# Patient Record
Sex: Female | Born: 1985 | Race: White | Hispanic: No | Marital: Married | Smoking: Never smoker
Health system: Southern US, Community
[De-identification: ages and names within clinical notes are randomized; demographics above are authoritative.]

## PROBLEM LIST (undated history)

## (undated) ENCOUNTER — Inpatient Hospital Stay (HOSPITAL_COMMUNITY): Payer: Self-pay

## (undated) DIAGNOSIS — Z789 Other specified health status: Secondary | ICD-10-CM

## (undated) HISTORY — PX: BREAST BIOPSY: SHX20

## (undated) HISTORY — PX: WISDOM TOOTH EXTRACTION: SHX21

## (undated) HISTORY — PX: TONSILLECTOMY: SUR1361

## (undated) HISTORY — PX: DILATION AND CURETTAGE OF UTERUS: SHX78

---

## 2012-05-09 ENCOUNTER — Encounter (HOSPITAL_COMMUNITY): Payer: Self-pay | Admitting: *Deleted

## 2012-05-10 ENCOUNTER — Ambulatory Visit (HOSPITAL_COMMUNITY): Payer: PRIVATE HEALTH INSURANCE | Admitting: Anesthesiology

## 2012-05-10 ENCOUNTER — Encounter (HOSPITAL_COMMUNITY)
Admission: AD | Disposition: A | Discharge status: Home / Self Care | Payer: Self-pay | Source: Ambulatory Visit | Attending: Obstetrics and Gynecology

## 2012-05-10 ENCOUNTER — Encounter (HOSPITAL_COMMUNITY): Payer: Self-pay | Admitting: Anesthesiology

## 2012-05-10 ENCOUNTER — Encounter (HOSPITAL_COMMUNITY): Payer: Self-pay | Admitting: *Deleted

## 2012-05-10 ENCOUNTER — Ambulatory Visit (HOSPITAL_COMMUNITY)
Admission: AD | Admit: 2012-05-10 | Discharge: 2012-05-10 | Disposition: A | Discharge status: Home / Self Care | Payer: PRIVATE HEALTH INSURANCE | Source: Ambulatory Visit | Attending: Obstetrics and Gynecology | Admitting: Obstetrics and Gynecology

## 2012-05-10 DIAGNOSIS — O021 Missed abortion: Secondary | ICD-10-CM | POA: Insufficient documentation

## 2012-05-10 DIAGNOSIS — Z9889 Other specified postprocedural states: Secondary | ICD-10-CM

## 2012-05-10 DIAGNOSIS — O039 Complete or unspecified spontaneous abortion without complication: Secondary | ICD-10-CM

## 2012-05-10 DIAGNOSIS — O034 Incomplete spontaneous abortion without complication: Secondary | ICD-10-CM | POA: Diagnosis present

## 2012-05-10 HISTORY — DX: Other specified health status: Z78.9

## 2012-05-10 HISTORY — PX: DILATION AND EVACUATION: SHX1459

## 2012-05-10 LAB — CBC
HCT: 36.7 % (ref 36.0–46.0)
Hemoglobin: 12.1 g/dL (ref 12.0–15.0)
MCH: 29.7 pg (ref 26.0–34.0)
RBC: 4.07 MIL/uL (ref 3.87–5.11)

## 2012-05-10 SURGERY — DILATION AND EVACUATION, UTERUS
Anesthesia: Monitor Anesthesia Care | Site: Vagina | Wound class: Clean Contaminated

## 2012-05-10 MED ORDER — ONDANSETRON HCL 4 MG/2ML IJ SOLN
INTRAMUSCULAR | Status: AC
  Filled 2012-05-10: qty 2

## 2012-05-10 MED ORDER — LACTATED RINGERS IV SOLN
INTRAVENOUS | Status: DC
  Administered 2012-05-10 (×2): via INTRAVENOUS
  Administered 2012-05-10: 125 mL/h via INTRAVENOUS

## 2012-05-10 MED ORDER — MIDAZOLAM HCL 5 MG/5ML IJ SOLN
INTRAMUSCULAR | Status: DC | PRN
  Administered 2012-05-10: 2 mg via INTRAVENOUS

## 2012-05-10 MED ORDER — CHLOROPROCAINE HCL 1 % IJ SOLN
INTRAMUSCULAR | Status: DC | PRN
  Administered 2012-05-10: 20 mL

## 2012-05-10 MED ORDER — FENTANYL CITRATE 0.05 MG/ML IJ SOLN
INTRAMUSCULAR | Status: DC | PRN
  Administered 2012-05-10 (×4): 50 ug via INTRAVENOUS

## 2012-05-10 MED ORDER — CEFAZOLIN SODIUM-DEXTROSE 2-3 GM-% IV SOLR
2.0000 g | INTRAVENOUS | Status: AC
  Administered 2012-05-10: 2 g via INTRAVENOUS

## 2012-05-10 MED ORDER — LIDOCAINE HCL (CARDIAC) 20 MG/ML IV SOLN
INTRAVENOUS | Status: AC
  Filled 2012-05-10: qty 5

## 2012-05-10 MED ORDER — FENTANYL CITRATE 0.05 MG/ML IJ SOLN
INTRAMUSCULAR | Status: AC
  Filled 2012-05-10: qty 8

## 2012-05-10 MED ORDER — MIDAZOLAM HCL 2 MG/2ML IJ SOLN
INTRAMUSCULAR | Status: AC
  Filled 2012-05-10: qty 2

## 2012-05-10 MED ORDER — KETOROLAC TROMETHAMINE 30 MG/ML IJ SOLN
INTRAMUSCULAR | Status: AC
  Filled 2012-05-10: qty 1

## 2012-05-10 MED ORDER — FENTANYL CITRATE 0.05 MG/ML IJ SOLN: 25.0000 ug | INTRAMUSCULAR | Status: DC | PRN

## 2012-05-10 MED ORDER — PROPOFOL 10 MG/ML IV EMUL
INTRAVENOUS | Status: AC
  Filled 2012-05-10: qty 40

## 2012-05-10 MED ORDER — LIDOCAINE HCL (CARDIAC) 20 MG/ML IV SOLN
INTRAVENOUS | Status: DC | PRN
  Administered 2012-05-10 (×2): 20 mg via INTRAVENOUS

## 2012-05-10 MED ORDER — KETOROLAC TROMETHAMINE 30 MG/ML IJ SOLN
INTRAMUSCULAR | Status: DC | PRN
  Administered 2012-05-10: 30 mg via INTRAMUSCULAR

## 2012-05-10 MED ORDER — KETOROLAC TROMETHAMINE 30 MG/ML IJ SOLN: 15.0000 mg | Freq: Once | INTRAMUSCULAR | Status: DC | PRN

## 2012-05-10 MED ORDER — PROPOFOL 10 MG/ML IV EMUL
INTRAVENOUS | Status: DC | PRN
  Administered 2012-05-10: 75 ug/kg/min via INTRAVENOUS

## 2012-05-10 MED ORDER — CHLOROPROCAINE HCL 1 % IJ SOLN
INTRAMUSCULAR | Status: AC
  Filled 2012-05-10: qty 30

## 2012-05-10 MED ORDER — CEFAZOLIN SODIUM-DEXTROSE 2-3 GM-% IV SOLR
INTRAVENOUS | Status: AC
  Filled 2012-05-10: qty 50

## 2012-05-10 MED ORDER — ONDANSETRON HCL 4 MG/2ML IJ SOLN
INTRAMUSCULAR | Status: DC | PRN
  Administered 2012-05-10: 4 mg via INTRAVENOUS

## 2012-05-10 MED ORDER — PROPOFOL 10 MG/ML IV EMUL
INTRAVENOUS | Status: DC | PRN
  Administered 2012-05-10: 40 mg via INTRAVENOUS
  Administered 2012-05-10 (×2): 20 mg via INTRAVENOUS

## 2012-05-10 SURGICAL SUPPLY — 17 items
CLOTH BEACON ORANGE TIMEOUT ST (SAFETY) ×2 IMPLANT
DECANTER SPIKE VIAL GLASS SM (MISCELLANEOUS) ×2 IMPLANT
GLOVE BIO SURGEON STRL SZ7 (GLOVE) ×2 IMPLANT
GOWN STRL REIN XL XLG (GOWN DISPOSABLE) ×4 IMPLANT
KIT BERKELEY 1ST TRIMESTER 3/8 (MISCELLANEOUS) ×2 IMPLANT
NEEDLE SPNL 20GX3.5 QUINCKE YW (NEEDLE) ×2 IMPLANT
NS IRRIG 1000ML POUR BTL (IV SOLUTION) ×2 IMPLANT
PACK VAGINAL MINOR WOMEN LF (CUSTOM PROCEDURE TRAY) ×2 IMPLANT
PAD OB MATERNITY 4.3X12.25 (PERSONAL CARE ITEMS) ×2 IMPLANT
PAD PREP 24X48 CUFFED NSTRL (MISCELLANEOUS) ×2 IMPLANT
SET BERKELEY SUCTION TUBING (SUCTIONS) ×2 IMPLANT
SYR CONTROL 10ML LL (SYRINGE) ×2 IMPLANT
TOWEL OR 17X24 6PK STRL BLUE (TOWEL DISPOSABLE) ×4 IMPLANT
VACURETTE 10 RIGID CVD (CANNULA) IMPLANT
VACURETTE 7MM CVD STRL WRAP (CANNULA) IMPLANT
VACURETTE 8 RIGID CVD (CANNULA) ×2 IMPLANT
VACURETTE 9 RIGID CVD (CANNULA) IMPLANT

## 2012-05-10 NOTE — Transfer of Care (Signed)
Immediate Anesthesia Transfer of Care Note  Patient: Laurie [name]  Procedure(s) Performed: Procedure(s) (LRB) with comments: DILATATION AND EVACUATION (N/A)  Patient Location: PACU  Anesthesia Type:MAC  Level of Consciousness: awake, alert , oriented and patient cooperative  Airway & Oxygen Therapy: Patient Spontanous Breathing and Patient connected to nasal cannula oxygen  Post-op Assessment: Report given to PACU RN and Post -op Vital signs reviewed and stable  Post vital signs: Reviewed and stable  Complications: No apparent anesthesia complications

## 2012-05-10 NOTE — H&P (Signed)
  History and physical exam unchanged 

## 2012-05-10 NOTE — H&P (Signed)
Laurie [name] is an 27 y.o. female.presents for dilation and evacuation for nonviable first trimester pregnancy.  By dates should be 9+ weeks.  Sonogram last week c/w 6+ maybe small fetal pole but no heart activity.  Had bleeding over the weekend and sono yesterday still no change and sac is irregular.  Blood type O+.  Pertinent Gynecological History: Menses: regular every month without intermenstrual spotting Bleeding: from SAB Contraception: none DES exposure: denies Blood transfusions: none Sexually transmitted diseases: no past history Previous GYN Procedures: none  Last mammogram: na Date: na Last pap: normal Date: less then one year OB History: G1, P0   Menstrual History: Menarche age: 35 No LMP recorded.    Past Medical History  Diagnosis Date  . No pertinent past medical history     Past Surgical History  Procedure Date  . Tonsillectomy     History reviewed. No pertinent family history.  Social History:  reports that she has never smoked. She does not have any smokeless tobacco history on file. She reports that she does not drink alcohol or use illicit drugs.  Allergies:  Allergies  Allergen Reactions  . Sulfa Antibiotics Hives    No prescriptions prior to admission    Review of Systems  All other systems reviewed and are negative.    There were no vitals taken for this visit. Physical Exam  Constitutional: She is oriented to person, place, and time. She appears well-developed and well-nourished.  HENT:  Head: Normocephalic and atraumatic.  Eyes: Pupils are equal, round, and reactive to light.  Cardiovascular: Normal rate, regular rhythm and normal heart sounds.   Respiratory: Effort normal and breath sounds normal.  GI: Soft. Bowel sounds are normal.  Genitourinary:       Minimal vaginal bleeding. Uterus 8-9 weeks in size. Adnexa clear   Musculoskeletal: She exhibits no edema.  Neurological: She is alert and oriented to person, place, and time.     No results found for this or any previous visit (from the past 24 hour(s)).  No results found.  Assessment/Plan: Nonviable first trimester pregnancy. Precede with dilation and curettage.  Risks discussed including:  Infection;  Hemorrhage that could require transfusion with risk of aids or hepatitis; excessive bleeding could require hysterectomy; possible need for repeat procedure for retained poc;  Risk of perforation which could require further surgery for damage to adjacent organs; risk of DVT and PE.  Rhyker Silversmith S 05/10/2012, 5:36 AM

## 2012-05-10 NOTE — Op Note (Signed)
Patient name  Laurie [name], bally DICTATION#  [id number] CSN# [id number]  [name], MD 05/10/2012 1:30 PM

## 2012-05-10 NOTE — Preoperative (Signed)
Beta Blockers   Reason not to administer Beta Blockers:Not Applicable 

## 2012-05-10 NOTE — Brief Op Note (Signed)
05/10/2012  1:28 PM  PATIENT:  Laurie [name]  27 y.o. female  PRE-OPERATIVE DIAGNOSIS:  MISSED AB CPT [id number]  POST-OPERATIVE DIAGNOSIS:  missed abortion  PROCEDURE:  Procedure(s) (LRB) with comments: DILATATION AND EVACUATION (N/A)  SURGEON:  Surgeon(s) and Role:    * [name], MD - Primary  PHYSICIAN ASSISTANT:   ASSISTANTS: none   ANESTHESIA:   IV sedation and paracervical block  EBL:     BLOOD ADMINISTERED:none  DRAINS: none   LOCAL MEDICATIONS USED:  OTHER nesicaine  SPECIMEN:  Source of Specimen:  products of conception  DISPOSITION OF SPECIMEN:  PATHOLOGY  COUNTS:  YES  TOURNIQUET:  * No tourniquets in log *  DICTATION: .Other Dictation: Dictation Number [id number]  PLAN OF CARE: Discharge to home after PACU  PATIENT DISPOSITION:  PACU - hemodynamically stable.   Delay start of Pharmacological VTE agent (>24hrs) due to surgical blood loss or risk of bleeding: not applicable

## 2012-05-10 NOTE — Anesthesia Preprocedure Evaluation (Signed)
Anesthesia Evaluation  Patient identified by MRN, date of birth, ID band Patient awake    Reviewed: Allergy & Precautions, H&P , NPO status , Patient's Chart, lab work & pertinent test results, reviewed documented beta blocker date and time   History of Anesthesia Complications Negative for: history of anesthetic complications  Airway Mallampati: II TM Distance: <3 FB Neck ROM: full    Dental  (+) Teeth Intact   Pulmonary neg pulmonary ROS,  breath sounds clear to auscultation  Pulmonary exam normal       Cardiovascular Exercise Tolerance: Good negative cardio ROS  Rhythm:regular Rate:Normal     Neuro/Psych negative neurological ROS  negative psych ROS   GI/Hepatic negative GI ROS, Neg liver ROS,   Endo/Other  negative endocrine ROS  Renal/GU negative Renal ROS  negative genitourinary   Musculoskeletal   Abdominal   Peds  Hematology negative hematology ROS (+)   Anesthesia Other Findings   Reproductive/Obstetrics (+) Pregnancy (missed ab)                           Anesthesia Physical Anesthesia Plan  ASA: II  Anesthesia Plan: MAC   Post-op Pain Management:    Induction:   Airway Management Planned:   Additional Equipment:   Intra-op Plan:   Post-operative Plan:   Informed Consent: I have reviewed the patients History and Physical, chart, labs and discussed the procedure including the risks, benefits and alternatives for the proposed anesthesia with the patient or authorized representative who has indicated his/her understanding and acceptance.   Dental Advisory Given  Plan Discussed with: CRNA and Surgeon  Anesthesia Plan Comments:         Anesthesia Quick Evaluation

## 2012-05-10 NOTE — Anesthesia Postprocedure Evaluation (Signed)
Anesthesia Post Note  Patient: Laurie [name]  Procedure(s) Performed: Procedure(s) (LRB): DILATATION AND EVACUATION (N/A)  Anesthesia type: MAC  Patient location: PACU  Post pain: Pain level controlled  Post assessment: Post-op Vital signs reviewed  Last Vitals:  Filed Vitals:   05/10/12 1400  BP: 118/61  Pulse: 72  Temp: 36.9 C  Resp: 19    Post vital signs: Reviewed  Level of consciousness: sedated  Complications: No apparent anesthesia complications

## 2012-05-11 ENCOUNTER — Encounter (HOSPITAL_COMMUNITY): Payer: Self-pay | Admitting: Obstetrics and Gynecology

## 2012-05-11 NOTE — Op Note (Signed)
[name], Laurie [name]                  ACCOUNT NO.:  [id number]  MEDICAL RECORD NO.:  [id number]  LOCATION:  [facility]                          FACILITY:  [facility]  PHYSICIAN:  [name], M.D.   DATE OF BIRTH:  May 21, 1985  DATE OF PROCEDURE:  05/10/2012 DATE OF DISCHARGE:  05/10/2012                              OPERATIVE REPORT   PREOPERATIVE DIAGNOSIS:  Nonviable 1st trimester pregnancy.  POSTOPERATIVE DIAGNOSIS:  Nonviable 1st trimester pregnancy.  OPERATIVE PROCEDURE:  Paracervical block with cervical dilatation, evacuation of uterine contents.  ANESTHESIA:  Paracervical block and sedation.  BLOOD LOSS:  Minimal.  PACKS AND DRAINS:  None.  INTRAOPERATIVE BLOOD PLACED:  None.  COMPLICATIONS:  None.  INDICATIONS:  As previously dictated in the H and P.  PROCEDURE:  As follows:  The patient was taken to the OR and placed in supine position.  After sedation, she was placed in dorsal lithotomy position using the Allen stirrups.  The patient then draped as a sterile field.  A speculum was placed in vaginal vault.  The cervix and vagina were cleansed out with Betadine.  Anterior lip of the cervix was anesthetized with 1% Nesacaine and secured with single-tooth tenaculum. We then put a paracervical block using approximately 20 mL of 1% Nesacaine.  Uterus was posterior sounded to approximately 10-11 cm. Cervix was serially dilated to a size 27 Pratt dilator.  Size 8 curved suction curette was introduced.  Intrauterine contents were evacuated using suction curetting.  After approximately 3 passes, no tissue was obtained.  Uterus was contracting down well.  At this point in time, we sharply curetted the intrauterine cavity.  It felt clear by its gritty feel.  Repeat suction curetting revealed no additional tissue.  Again, the uterus was contracting down well.  There was minimal bleeding.  No signs of perforation.  Of note, the patient's blood type is O positive. At this point in time,  the single- tooth tenaculum and speculum removed.  The patient taken out of the dorsal lithotomy position.  Once alert, transferred to recovery room in good condition.  Sponge, instrument, and needle count was correct by circulating nurse x2.     [name], M.D.     JSM/MEDQ  D:  05/10/2012  T:  05/11/2012  Job:  [id number]

## 2012-08-04 ENCOUNTER — Encounter (HOSPITAL_COMMUNITY): Payer: Self-pay | Admitting: *Deleted

## 2012-08-06 ENCOUNTER — Encounter (HOSPITAL_COMMUNITY): Payer: Self-pay | Admitting: Pharmacy Technician

## 2012-08-07 ENCOUNTER — Encounter (HOSPITAL_COMMUNITY): Payer: Self-pay | Admitting: Anesthesiology

## 2012-08-07 ENCOUNTER — Ambulatory Visit (HOSPITAL_COMMUNITY)
Admission: RE | Admit: 2012-08-07 | Discharge: 2012-08-07 | Disposition: A | Discharge status: Home / Self Care | Payer: PRIVATE HEALTH INSURANCE | Source: Ambulatory Visit | Attending: Obstetrics and Gynecology | Admitting: Obstetrics and Gynecology

## 2012-08-07 ENCOUNTER — Ambulatory Visit (HOSPITAL_COMMUNITY): Payer: PRIVATE HEALTH INSURANCE | Admitting: Anesthesiology

## 2012-08-07 ENCOUNTER — Encounter (HOSPITAL_COMMUNITY)
Admission: RE | Disposition: A | Discharge status: Home / Self Care | Payer: Self-pay | Source: Ambulatory Visit | Attending: Obstetrics and Gynecology

## 2012-08-07 DIAGNOSIS — O034 Incomplete spontaneous abortion without complication: Secondary | ICD-10-CM

## 2012-08-07 DIAGNOSIS — Z9889 Other specified postprocedural states: Secondary | ICD-10-CM

## 2012-08-07 DIAGNOSIS — O021 Missed abortion: Secondary | ICD-10-CM | POA: Insufficient documentation

## 2012-08-07 HISTORY — DX: Other specified health status: Z78.9

## 2012-08-07 HISTORY — PX: DILATION AND EVACUATION: SHX1459

## 2012-08-07 LAB — CBC
MCHC: 33.4 g/dL (ref 30.0–36.0)
Platelets: 220 10*3/uL (ref 150–400)
RDW: 13.5 % (ref 11.5–15.5)

## 2012-08-07 LAB — ABO/RH: ABO/RH(D): O POS

## 2012-08-07 SURGERY — DILATION AND EVACUATION, UTERUS
Anesthesia: Monitor Anesthesia Care | Site: Uterus | Wound class: Clean Contaminated

## 2012-08-07 MED ORDER — KETOROLAC TROMETHAMINE 30 MG/ML IJ SOLN
INTRAMUSCULAR | Status: DC | PRN
  Administered 2012-08-07: 30 mg via INTRAVENOUS

## 2012-08-07 MED ORDER — LIDOCAINE HCL (CARDIAC) 20 MG/ML IV SOLN
INTRAVENOUS | Status: AC
  Filled 2012-08-07: qty 5

## 2012-08-07 MED ORDER — FENTANYL CITRATE 0.05 MG/ML IJ SOLN
INTRAMUSCULAR | Status: DC | PRN
  Administered 2012-08-07 (×2): 50 ug via INTRAVENOUS

## 2012-08-07 MED ORDER — MIDAZOLAM HCL 2 MG/2ML IJ SOLN
INTRAMUSCULAR | Status: AC
  Filled 2012-08-07: qty 2

## 2012-08-07 MED ORDER — CEFAZOLIN SODIUM-DEXTROSE 2-3 GM-% IV SOLR
INTRAVENOUS | Status: AC
  Filled 2012-08-07: qty 50

## 2012-08-07 MED ORDER — OXYCODONE-ACETAMINOPHEN 7.5-325 MG PO TABS: 1.0000 | ORAL_TABLET | ORAL | Status: DC | PRN

## 2012-08-07 MED ORDER — ONDANSETRON HCL 4 MG/2ML IJ SOLN
INTRAMUSCULAR | Status: DC | PRN
  Administered 2012-08-07: 4 mg via INTRAVENOUS

## 2012-08-07 MED ORDER — LACTATED RINGERS IV SOLN
INTRAVENOUS | Status: DC
  Administered 2012-08-07 (×2): via INTRAVENOUS

## 2012-08-07 MED ORDER — CHLOROPROCAINE HCL 1 % IJ SOLN
INTRAMUSCULAR | Status: AC
  Filled 2012-08-07: qty 30

## 2012-08-07 MED ORDER — MIDAZOLAM HCL 5 MG/5ML IJ SOLN
INTRAMUSCULAR | Status: DC | PRN
  Administered 2012-08-07: 2 mg via INTRAVENOUS

## 2012-08-07 MED ORDER — LIDOCAINE HCL (CARDIAC) 20 MG/ML IV SOLN
INTRAVENOUS | Status: DC | PRN
  Administered 2012-08-07 (×2): 30 mg via INTRAVENOUS

## 2012-08-07 MED ORDER — PROPOFOL 10 MG/ML IV EMUL
INTRAVENOUS | Status: AC
  Filled 2012-08-07: qty 20

## 2012-08-07 MED ORDER — FENTANYL CITRATE 0.05 MG/ML IJ SOLN: 25.0000 ug | INTRAMUSCULAR | Status: DC | PRN

## 2012-08-07 MED ORDER — METOCLOPRAMIDE HCL 5 MG/ML IJ SOLN: 10.0000 mg | Freq: Once | INTRAMUSCULAR | Status: DC | PRN

## 2012-08-07 MED ORDER — PROPOFOL 10 MG/ML IV EMUL
INTRAVENOUS | Status: DC | PRN
  Administered 2012-08-07 (×2): 30 mg via INTRAVENOUS
  Administered 2012-08-07 (×2): 20 mg via INTRAVENOUS

## 2012-08-07 MED ORDER — CEFAZOLIN SODIUM-DEXTROSE 2-3 GM-% IV SOLR
2.0000 g | INTRAVENOUS | Status: AC
  Administered 2012-08-07: 2 g via INTRAVENOUS

## 2012-08-07 MED ORDER — KETOROLAC TROMETHAMINE 30 MG/ML IJ SOLN
INTRAMUSCULAR | Status: AC
  Filled 2012-08-07: qty 1

## 2012-08-07 MED ORDER — ONDANSETRON HCL 4 MG/2ML IJ SOLN
INTRAMUSCULAR | Status: AC
  Filled 2012-08-07: qty 2

## 2012-08-07 MED ORDER — CHLOROPROCAINE HCL 1 % IJ SOLN
INTRAMUSCULAR | Status: DC | PRN
  Administered 2012-08-07: 20 mL

## 2012-08-07 MED ORDER — MEPERIDINE HCL 25 MG/ML IJ SOLN: 6.2500 mg | INTRAMUSCULAR | Status: DC | PRN

## 2012-08-07 MED ORDER — FENTANYL CITRATE 0.05 MG/ML IJ SOLN
INTRAMUSCULAR | Status: AC
  Filled 2012-08-07: qty 2

## 2012-08-07 SURGICAL SUPPLY — 18 items
CLOTH BEACON ORANGE TIMEOUT ST (SAFETY) ×2 IMPLANT
DECANTER SPIKE VIAL GLASS SM (MISCELLANEOUS) ×2 IMPLANT
GLOVE BIO SURGEON STRL SZ7 (GLOVE) ×2 IMPLANT
GLOVE SURG SS PI 6.5 STRL IVOR (GLOVE) ×2 IMPLANT
GOWN STRL REIN XL XLG (GOWN DISPOSABLE) ×4 IMPLANT
KIT BERKELEY 1ST TRIMESTER 3/8 (MISCELLANEOUS) ×2 IMPLANT
NEEDLE SPNL 20GX3.5 QUINCKE YW (NEEDLE) ×2 IMPLANT
NS IRRIG 1000ML POUR BTL (IV SOLUTION) ×2 IMPLANT
PACK VAGINAL MINOR WOMEN LF (CUSTOM PROCEDURE TRAY) ×2 IMPLANT
PAD OB MATERNITY 4.3X12.25 (PERSONAL CARE ITEMS) ×2 IMPLANT
PAD PREP 24X48 CUFFED NSTRL (MISCELLANEOUS) ×2 IMPLANT
SET BERKELEY SUCTION TUBING (SUCTIONS) ×2 IMPLANT
SYR CONTROL 10ML LL (SYRINGE) ×2 IMPLANT
TOWEL OR 17X24 6PK STRL BLUE (TOWEL DISPOSABLE) ×4 IMPLANT
VACURETTE 10 RIGID CVD (CANNULA) IMPLANT
VACURETTE 7MM CVD STRL WRAP (CANNULA) IMPLANT
VACURETTE 8 RIGID CVD (CANNULA) ×2 IMPLANT
VACURETTE 9 RIGID CVD (CANNULA) IMPLANT

## 2012-08-07 NOTE — Transfer of Care (Signed)
Immediate Anesthesia Transfer of Care Note  Patient: Laurie [name]  Procedure(s) Performed: Procedure(s) with comments: DILATATION AND EVACUATION WITH GENETIC STUDIES (N/A) - chromosome studies  Patient Location: PACU  Anesthesia Type:MAC  Level of Consciousness: awake, alert , sedated and patient cooperative  Airway & Oxygen Therapy: Patient Spontanous Breathing and Patient connected to nasal cannula oxygen  Post-op Assessment: Report given to PACU RN and Post -op Vital signs reviewed and stable  Post vital signs: Reviewed and stable  Complications: No apparent anesthesia complications

## 2012-08-07 NOTE — Anesthesia Postprocedure Evaluation (Signed)
  Anesthesia Post-op Note  Patient: Laurie [name]  Procedure(s) Performed: Procedure(s) with comments: DILATATION AND EVACUATION WITH GENETIC STUDIES (N/A) - chromosome studies  Patient Location: PACU  Anesthesia Type:MAC  Level of Consciousness: awake, alert  and oriented  Airway and Oxygen Therapy: Patient Spontanous Breathing  Post-op Pain: none  Post-op Assessment: Post-op Vital signs reviewed, Patient's Cardiovascular Status Stable, Respiratory Function Stable, Patent Airway, No signs of Nausea or vomiting and Pain level controlled  Post-op Vital Signs: Reviewed and stable  Complications: No apparent anesthesia complications

## 2012-08-07 NOTE — H&P (Signed)
  History and physical exam unchanged 

## 2012-08-07 NOTE — H&P (Signed)
Laurie [name] is an 27 y.o. female.presenting for dilation and evacuation.  Followed with serial sonograms.  Started bleeding last week.  Sonogram with gestational sac but no fetal pole or yolk sac now for evacuation.    Pertinent Gynecological History: Menses: regular every month without intermenstrual spotting Bleeding: none Contraception: none DES exposure: denies Blood transfusions: none Sexually transmitted diseases: no past history Previous GYN Procedures: DNC  Last mammogram: na Date: na  Last pap: normal Date: 2013 OB History: G2, P0   Menstrual History: Menarche age: 24  Patient's last menstrual period was 06/05/2012.    Past Medical History  Diagnosis Date  . No pertinent past medical history   . Medical history non-contributory     Past Surgical History  Procedure Laterality Date  . Tonsillectomy    . Wisdom tooth extraction    . Dilation and evacuation  05/10/2012    Procedure: DILATATION AND EVACUATION;  Surgeon: [name], MD;  Location: [facility];  Service: Gynecology;  Laterality: N/A;    History reviewed. No pertinent family history.  Social History:  reports that she has never smoked. She has never used smokeless tobacco. She reports that she does not drink alcohol or use illicit drugs.  Allergies:  Allergies  Allergen Reactions  . Sulfa Antibiotics Hives    paralysis    No prescriptions prior to admission    Review of Systems  All other systems reviewed and are negative.    Last menstrual period 06/05/2012. Physical Exam  Constitutional: She is oriented to person, place, and time. She appears well-developed and well-nourished.  Eyes: Pupils are equal, round, and reactive to light.  Cardiovascular: Normal rate, regular rhythm and normal heart sounds.   Respiratory: Effort normal and breath sounds normal.  GI: Soft. Bowel sounds are normal.  Genitourinary:  Uterus 9 weeks adenexae clear  Neurological: She is alert and oriented to person,  place, and time.    No results found for this or any previous visit (from the past 24 hour(s)).  No results found.  Assessment/Plan: Nonviable first trimester pregnancy Precede with dilation and curettage.  Risks discussed.  Infection.  Hemorrhage that could require transfusions with risk of aids and or hepatitis. Excessive bleeding could require hysterectomy.  Perforation that could lead to injury to adjacent organs that could require exploratory surgery.  Risk of DVT and PE>   Laurie [name] S 08/07/2012, 7:32 AM

## 2012-08-07 NOTE — Anesthesia Preprocedure Evaluation (Signed)
Anesthesia Evaluation  Patient identified by MRN, date of birth, ID band Patient awake    Reviewed: Allergy & Precautions, H&P , NPO status , Patient's Chart, lab work & pertinent test results  Airway Mallampati: II TM Distance: >3 FB Neck ROM: Full    Dental  (+) Teeth Intact   Pulmonary neg pulmonary ROS,  breath sounds clear to auscultation  Pulmonary exam normal       Cardiovascular negative cardio ROS  Rhythm:Regular Rate:Normal     Neuro/Psych negative neurological ROS  negative psych ROS   GI/Hepatic negative GI ROS, Neg liver ROS,   Endo/Other  negative endocrine ROS  Renal/GU negative Renal ROS     Musculoskeletal negative musculoskeletal ROS (+)   Abdominal   Peds  Hematology negative hematology ROS (+)   Anesthesia Other Findings   Reproductive/Obstetrics (+) Pregnancy Missed Ab                           Anesthesia Physical Anesthesia Plan  ASA: II  Anesthesia Plan: MAC   Post-op Pain Management:    Induction: Intravenous  Airway Management Planned: Natural Airway  Additional Equipment:   Intra-op Plan:   Post-operative Plan: Extubation in OR  Informed Consent: I have reviewed the patients History and Physical, chart, labs and discussed the procedure including the risks, benefits and alternatives for the proposed anesthesia with the patient or authorized representative who has indicated his/her understanding and acceptance.   Dental advisory given  Plan Discussed with: CRNA, Anesthesiologist and Surgeon  Anesthesia Plan Comments:         Anesthesia Quick Evaluation

## 2012-08-07 NOTE — Brief Op Note (Signed)
08/07/2012  1:15 PM  PATIENT:  Laurie [name]  27 y.o. female  PRE-OPERATIVE DIAGNOSIS:  missed abortion   POST-OPERATIVE DIAGNOSIS:  missed abortion  PROCEDURE:  Procedure(s) with comments: DILATATION AND EVACUATION WITH GENETIC STUDIES (N/A) - chromosome studies  SURGEON:  Surgeon(s) and Role:    * [name], MD - Primary  PHYSICIAN ASSISTANT:   ASSISTANTS: none   ANESTHESIA:   IV sedation and paracervical block  EBL:  Total I/O In: 1000 [I.V.:1000] Out: -   BLOOD ADMINISTERED:none  DRAINS: none   LOCAL MEDICATIONS USED:  Amount: 20 ml and OTHER nesicaine  SPECIMEN:  Source of Specimen:  products of conception  DISPOSITION OF SPECIMEN:  PATHOLOGY  COUNTS:  YES  TOURNIQUET:  * No tourniquets in log *  DICTATION: .Other Dictation: Dictation Number [id number]  PLAN OF CARE: Discharge to home after PACU  PATIENT DISPOSITION:  PACU - hemodynamically stable.   Delay start of Pharmacological VTE agent (>24hrs) due to surgical blood loss or risk of bleeding: not applicable

## 2012-08-07 NOTE — Op Note (Signed)
Patient name Laurie [name], [place] DICTATION#  [id number] CSN# [id number]  [name], MD 08/07/2012 1:16 PM

## 2012-08-08 ENCOUNTER — Encounter (HOSPITAL_COMMUNITY): Payer: Self-pay | Admitting: Obstetrics and Gynecology

## 2012-08-08 NOTE — Op Note (Signed)
NAMENOVELLE, ADDAIR                  ACCOUNT NO.:  [id number]  MEDICAL RECORD NO.:  [id number]  LOCATION:  [facility]                          [facility]  PHYSICIAN:  [name], M.D.   DATE OF BIRTH:  [birth year]  DATE OF PROCEDURE:  08/07/2012 DATE OF DISCHARGE:  08/07/2012                              OPERATIVE REPORT   PREOPERATIVE DIAGNOSIS:  Nonviable 1st trimester pregnancy.  POSTOPERATIVE DIAGNOSIS:  Nonviable 1st trimester pregnancy.  OPERATIVE PROCEDURE:  Paracervical block, cervical dilatation with uterine evacuation.  SURGEON:  [name], M.D.  ANESTHESIA:  Paracervical block and sedation.  BLOOD LOSS:  Minimal.  PACKS AND DRAINS:  None.  INTRAOPERATIVE BLOOD PLACED:  None.  COMPLICATIONS:  None.  INDICATION:  Dictated in history and physical.  PROCEDURE:  As follows; the patient was taken to the OR and placed in a supine position.  She was placed in the dorsal supine position using Allen stirrups.  After sedation, the patient was draped in sterile field.  A speculum was placed in vaginal vault.  Cervix was cleansed with Betadine.  Paracervical block using 1% Nesacaine.  Cervix was secured with a single-tooth tenaculum.  Uterus sounded to approximately 9 cm.  Cervix serially dilated to a size 27 Pratt dilator.  Size 8 curved suction curette was introduced, intrauterine contents were removed using suction curetting.  Once no additional tissue was obtained, we sharply curetted followed by repeat suction curetting followed by repeat sharp curetting, felt that all quadrants were cleared by gritty feel, no further tissue was obtained with suction curette. Uterus was contracting down well.  Bleeding was minimal.  Tissue was sent for genetics.  At this point in time, single-tooth speculum was removed.  The patient taken out of the dorsal lithotomy position.  Once alert, transferred to recovery room in good condition.  Sponge, instrument, and needle count  was reported as correct by circulating nurse x2.     [name], M.D.     JSM/MEDQ  D:  08/07/2012  T:  08/08/2012  Job:  [id number]

## 2012-08-31 LAB — CHROMOSOME STD, POC(TISSUE)-NCBH

## 2012-09-07 ENCOUNTER — Other Ambulatory Visit (HOSPITAL_COMMUNITY): Payer: Self-pay | Admitting: Obstetrics and Gynecology

## 2012-09-07 DIAGNOSIS — N96 Recurrent pregnancy loss: Secondary | ICD-10-CM

## 2012-09-07 DIAGNOSIS — Z8759 Personal history of other complications of pregnancy, childbirth and the puerperium: Secondary | ICD-10-CM

## 2012-09-13 ENCOUNTER — Other Ambulatory Visit (HOSPITAL_COMMUNITY): Payer: Self-pay | Admitting: Obstetrics and Gynecology

## 2012-09-13 ENCOUNTER — Encounter (HOSPITAL_COMMUNITY): Payer: Self-pay

## 2012-09-13 ENCOUNTER — Ambulatory Visit (HOSPITAL_COMMUNITY)
Admission: RE | Admit: 2012-09-13 | Discharge: 2012-09-13 | Disposition: A | Discharge status: Home / Self Care | Payer: PRIVATE HEALTH INSURANCE | Source: Ambulatory Visit | Attending: Obstetrics and Gynecology | Admitting: Obstetrics and Gynecology

## 2012-09-13 DIAGNOSIS — N96 Recurrent pregnancy loss: Secondary | ICD-10-CM | POA: Insufficient documentation

## 2012-09-13 MED ORDER — IOHEXOL 300 MG/ML  SOLN
20.0000 mL | Freq: Once | INTRAMUSCULAR | Status: AC | PRN
  Administered 2012-09-13: 20 mL

## 2012-12-22 LAB — OB RESULTS CONSOLE ABO/RH: RH Type: POSITIVE

## 2012-12-22 LAB — OB RESULTS CONSOLE HIV ANTIBODY (ROUTINE TESTING): HIV: NONREACTIVE

## 2012-12-22 LAB — OB RESULTS CONSOLE GC/CHLAMYDIA
Chlamydia: NEGATIVE
GC PROBE AMP, GENITAL: NEGATIVE

## 2012-12-22 LAB — OB RESULTS CONSOLE RPR: RPR: NONREACTIVE

## 2012-12-22 LAB — OB RESULTS CONSOLE HEPATITIS B SURFACE ANTIGEN: Hepatitis B Surface Ag: NEGATIVE

## 2012-12-22 LAB — OB RESULTS CONSOLE RUBELLA ANTIBODY, IGM: Rubella: IMMUNE

## 2012-12-22 LAB — OB RESULTS CONSOLE ANTIBODY SCREEN: ANTIBODY SCREEN: NEGATIVE

## 2013-01-08 ENCOUNTER — Inpatient Hospital Stay (HOSPITAL_COMMUNITY): Payer: PRIVATE HEALTH INSURANCE

## 2013-01-08 ENCOUNTER — Encounter (HOSPITAL_COMMUNITY): Payer: Self-pay | Admitting: *Deleted

## 2013-01-08 ENCOUNTER — Inpatient Hospital Stay (HOSPITAL_COMMUNITY)
Admission: AD | Admit: 2013-01-08 | Discharge: 2013-01-08 | Disposition: A | Discharge status: Home / Self Care | Payer: PRIVATE HEALTH INSURANCE | Source: Ambulatory Visit | Attending: Obstetrics and Gynecology | Admitting: Obstetrics and Gynecology

## 2013-01-08 DIAGNOSIS — O4690 Antepartum hemorrhage, unspecified, unspecified trimester: Secondary | ICD-10-CM

## 2013-01-08 DIAGNOSIS — O209 Hemorrhage in early pregnancy, unspecified: Secondary | ICD-10-CM | POA: Insufficient documentation

## 2013-01-08 LAB — URINALYSIS, ROUTINE W REFLEX MICROSCOPIC
Bilirubin Urine: NEGATIVE
Ketones, ur: NEGATIVE mg/dL
Nitrite: NEGATIVE
Specific Gravity, Urine: <1.005 — ABNORMAL LOW (ref 1.005–1.030)
Urobilinogen, UA: 0.2 mg/dL (ref 0.0–1.0)

## 2013-01-08 LAB — URINE MICROSCOPIC-ADD ON: WBC, UA: NONE SEEN WBC/hpf (ref <3)

## 2013-01-08 LAB — CBC
HCT: 32.8 % — ABNORMAL LOW (ref 36.0–46.0)
Hemoglobin: 11.2 g/dL — ABNORMAL LOW (ref 12.0–15.0)
MCH: 29.6 pg (ref 26.0–34.0)
MCHC: 34.1 g/dL (ref 30.0–36.0)
MCV: 86.8 fL (ref 78.0–100.0)
RDW: 13.6 % (ref 11.5–15.5)

## 2013-01-08 NOTE — MAU Provider Note (Signed)
Chief Complaint: Vaginal Bleeding   None    SUBJECTIVE HPI: Laurie [name] is a 27 y.o. G3P0020 at [redacted]w[redacted]d by LMP who presents to maternity admissions reporting bright red vaginal bleeding x1 episode when she wiped today.  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  She has had U/S in this pregnancy with [facility]HR in the office.  Last intercourse was yesterday.  She denies vaginal itching/burning, urinary symptoms, h/a, dizziness, n/v, or fever/chills.  Past Medical History  Diagnosis Date  . No pertinent past medical history   . Medical history non-contributory    Past Surgical History  Procedure Laterality Date  . Tonsillectomy    . Wisdom tooth extraction    . Dilation and evacuation  05/10/2012    Procedure: DILATATION AND EVACUATION;  Surgeon: [name], MD;  Location: [facility];  Service: Gynecology;  Laterality: N/A;  . Dilation and evacuation N/A 08/07/2012    Procedure: DILATATION AND EVACUATION WITH GENETIC STUDIES;  Surgeon: [name], MD;  Location: [facility];  Service: Gynecology;  Laterality: N/A;  chromosome studies   History   Social History  . Marital Status: Married    Spouse Name: N/A    Number of Children: N/A  . Years of Education: N/A   Occupational History  . Not on file.   Social History Main Topics  . Smoking status: Never Smoker   . Smokeless tobacco: Never Used  . Alcohol Use: No  . Drug Use: No  . Sexual Activity: Yes    Birth Control/ Protection: None     Comment: approx [redacted] wks gestation per pt   Other Topics Concern  . Not on file   Social History Narrative  . No narrative on file   No current facility-administered medications on file prior to encounter.   No current outpatient prescriptions on file prior to encounter.   Allergies  Allergen Reactions  . Sulfa Antibiotics Hives and Other (See Comments)    Causes paralysis    ROS: Pertinent items in HPI  OBJECTIVE Blood pressure 134/83, pulse 105, temperature 98.2 F  (36.8 C), temperature source Oral, resp. rate 18, last menstrual period 10/28/2012. GENERAL: Well-developed, well-nourished female in no acute distress.  HEENT: Normocephalic HEART: normal rate RESP: normal effort ABDOMEN: Soft, non-tender EXTREMITIES: Nontender, no edema NEURO: Alert and oriented Pelvic exam: Cervix pink, visually closed, without lesion, small amount dark brown blood, vaginal walls and external genitalia normal Bimanual exam: Cervix 0/long/high, firm, anterior, neg CMT, uterus nontender, ~10 week size, adnexa without tenderness, enlargement, or mass   LAB RESULTS Results for orders placed during the hospital encounter of 01/08/13 (from the past 24 hour(s))  CBC     Status: Abnormal   Collection Time    01/08/13  4:37 PM      Result Value Range   WBC 9.1  4.0 - 10.5 K/uL   RBC 3.78 (*) 3.87 - 5.11 MIL/uL   Hemoglobin 11.2 (*) 12.0 - 15.0 g/dL   HCT 16.1 (*) 09.6 - 04.5 %   MCV 86.8  78.0 - 100.0 fL   MCH 29.6  26.0 - 34.0 pg   MCHC 34.1  30.0 - 36.0 g/dL   RDW 40.9  81.1 - 91.4 %   Platelets 242  150 - 400 K/uL    IMAGING US Ob Comp Less 14 Wks  01/08/2013   *RADIOLOGY REPORT*  Clinical Data: Bleeding. 2 previous spontaneous abortions earlier this year.  OBSTETRIC <14  [facility] ULTRASOUND  Technique:  Transabdominal ultrasound was performed for evaluation of the gestation as well as the maternal uterus and adnexal regions.  Comparison:  None.  Intrauterine gestational sac: Single, normal in shape. Yolk sac: Present Embryo: Present Cardiac Activity: Present Heart Rate: 174 bpm  CRL:  4.35 cm 11 w  2 d       [place] EDC: 07/25/2013  Maternal uterus/Adnexae: Normal-appearing ovaries.  No subchorionic hemorrhage identified.  IMPRESSION:  1.  Single living intrauterine fetus corresponding to an age of 11 weeks 2 days. 2.  EDC by today's exam is 07/28/2013.   Original Report Authenticated By: Norva Pavlov, M.D.    ASSESSMENT 1. Vaginal bleeding in pregnancy, first trimester      PLAN Consult with Dr Rana Snare Discharge home F/U in office Return to MAU as needed    Medication List    ASK your doctor about these medications       aspirin EC 81 MG tablet  Take 81 mg by mouth daily.     folic acid 400 MCG tablet  Commonly known as:  FOLVITE  Take 400 mcg by mouth at bedtime.     prenatal multivitamin Tabs tablet  Take 1 tablet by mouth at bedtime.     Progesterone 50 MG Supp  Place 1 suppository vaginally 2 (two) times daily.         Sharen Counter Certified Nurse-Midwife 01/08/2013  4:58 PM

## 2013-01-08 NOTE — MAU Note (Signed)
Pt presents with complaints of vaginal bleeding that started at work today. She states that she noticed bleeding when she wiped and then noticed a lot of blood in the toilet when she went to  the bathroom. Pt is taking progesterone for the pregnancy due to having 2 previous miscarriages this year since January

## 2013-03-25 ENCOUNTER — Encounter (HOSPITAL_COMMUNITY): Payer: Self-pay | Admitting: Family

## 2013-03-25 ENCOUNTER — Inpatient Hospital Stay (HOSPITAL_COMMUNITY)
Admission: AD | Admit: 2013-03-25 | Discharge: 2013-03-25 | Disposition: A | Discharge status: Home / Self Care | Payer: PRIVATE HEALTH INSURANCE | Source: Ambulatory Visit | Attending: Obstetrics and Gynecology | Admitting: Obstetrics and Gynecology

## 2013-03-25 ENCOUNTER — Inpatient Hospital Stay (HOSPITAL_COMMUNITY): Payer: PRIVATE HEALTH INSURANCE

## 2013-03-25 DIAGNOSIS — O209 Hemorrhage in early pregnancy, unspecified: Secondary | ICD-10-CM | POA: Insufficient documentation

## 2013-03-25 DIAGNOSIS — O4690 Antepartum hemorrhage, unspecified, unspecified trimester: Secondary | ICD-10-CM

## 2013-03-25 DIAGNOSIS — O4692 Antepartum hemorrhage, unspecified, second trimester: Secondary | ICD-10-CM

## 2013-03-25 LAB — URINALYSIS, ROUTINE W REFLEX MICROSCOPIC
Ketones, ur: NEGATIVE mg/dL
Nitrite: NEGATIVE
Specific Gravity, Urine: 1.015 (ref 1.005–1.030)
pH: 7 (ref 5.0–8.0)

## 2013-03-25 LAB — URINE MICROSCOPIC-ADD ON

## 2013-03-25 NOTE — MAU Note (Signed)
27yo, G3P0 at [redacted]w[redacted]d, presents to MAU with c/o vaginal bleeding when wiping at 0830 today. Reports wearing one pantyliner since that occurrence; reports a little blood on the pantyliner at Triage.  Patient reports 2 miscarriages since January; is on daily baby Aspirin. Took progesterone in first trimester. Denies cramping/pain.

## 2013-03-25 NOTE — MAU Provider Note (Signed)
  History     CSN: [id number]  Arrival date and time: 03/25/13 1058   First Provider Initiated Contact with Patient 03/25/13 1218      Chief Complaint  Patient presents with  . Vaginal Bleeding   HPI Laurie [name] 27 y.o. [redacted]w[redacted]d   Client had painless bright red bleeding this morning upon awakening.  Denies any vaginal leaking.  Denies any cramping or lower abdominal pain.  Bleeding has stopped at this time.  Hx of 2 miscarriages and is taking a baby aspirin every day.    OB History   Grav Para Term Preterm Abortions TAB SAB Ect Mult Living   3 0 0 0 2 0 2 0 0 0       Past Medical History  Diagnosis Date  . No pertinent past medical history   . Medical history non-contributory     Past Surgical History  Procedure Laterality Date  . Tonsillectomy    . Wisdom tooth extraction    . Dilation and evacuation  05/10/2012    Procedure: DILATATION AND EVACUATION;  Surgeon: [name], MD;  Location: [facility];  Service: Gynecology;  Laterality: N/A;  . Dilation and evacuation N/A 08/07/2012    Procedure: DILATATION AND EVACUATION WITH GENETIC STUDIES;  Surgeon: [name], MD;  Location: [facility];  Service: Gynecology;  Laterality: N/A;  chromosome studies    History reviewed. No pertinent family history.  History  Substance Use Topics  . Smoking status: Never Smoker   . Smokeless tobacco: Never Used  . Alcohol Use: No    Allergies:  Allergies  Allergen Reactions  . Sulfa Antibiotics Hives and Other (See Comments)    Causes paralysis    Prescriptions prior to admission  Medication Sig Dispense Refill  . aspirin EC 81 MG tablet Take 81 mg by mouth daily.      . folic acid (FOLVITE) 400 MCG tablet Take 400 mcg by mouth at bedtime.      . Prenatal Vit-Fe Fumarate-FA (PRENATAL MULTIVITAMIN) TABS tablet Take 1 tablet by mouth at bedtime.        Review of Systems  Constitutional: Negative for fever.  Gastrointestinal: Negative for nausea, vomiting and abdominal pain.   Genitourinary: Negative for dysuria.       Vaginal bleeding. No vaginal leaking.   Physical Exam   Blood pressure 105/63, pulse 88, temperature 98.5 F (36.9 C), temperature source Oral, resp. rate 16, height 5\' 4"  (1.626 m), weight 140 lb (63.504 kg), last menstrual period 10/28/2012.  Physical Exam  Nursing note and vitals reviewed. Constitutional: She is oriented to person, place, and time. She appears well-developed and well-nourished. No distress.  HENT:  Head: Normocephalic.  Eyes: EOM are normal.  Neck: Neck supple.  Musculoskeletal: Normal range of motion.  Neurological: She is alert and oriented to person, place, and time.  Skin: Skin is warm and dry.  Psychiatric: She has a normal mood and affect.    MAU Course  Procedures  MDM 1230  Consult with Dr. Renaldo Fiddler re: plan of care 1445  Preliminary ultrasound reviewed and discussed with Dr. Renaldo Fiddler.  Assessment and Plan  Bleeding in pregnancy - cause unidentified  Plan Follow up in the office if you have additional bleeding.  Otherwise keep your prenatal care appointment as scheduled. Pelvic rest for the next few days.   Laurie [name] 03/25/2013, 12:30 PM

## 2013-03-26 LAB — URINE CULTURE: Culture: NO GROWTH

## 2013-05-03 NOTE — L&D Delivery Note (Signed)
Delivery Note At 6:00 AM a viable female was delivered via Vaginal, Spontaneous Delivery (Presentation: ;  ).  APGAR: 9, 9; weight .   Placenta status: Intact, Spontaneous.  Cord: 3 vessels with the following complications: None.  Cord pH: not sent Hx secondary placental lobe>>>placenta to path, ut explored>>>clean Rectum + sphincter intact  Anesthesia: Epidural  Episiotomy: Median Lacerations: none Suture Repair: 3.0 vicryl rapide Est. Blood Loss (mL): 400  Mom to postpartum.  Baby to Nursery.  Meriel PicaHOLLAND,Wah Sabic M 07/28/2013, 6:28 AM

## 2013-06-25 ENCOUNTER — Inpatient Hospital Stay (HOSPITAL_COMMUNITY)
Admission: AD | Admit: 2013-06-25 | Discharge: 2013-06-29 | DRG: 781 | Disposition: A | Discharge status: Home / Self Care | Payer: PRIVATE HEALTH INSURANCE | Source: Ambulatory Visit | Attending: Obstetrics and Gynecology | Admitting: Obstetrics and Gynecology

## 2013-06-25 ENCOUNTER — Encounter (HOSPITAL_COMMUNITY): Payer: Self-pay

## 2013-06-25 ENCOUNTER — Inpatient Hospital Stay (HOSPITAL_COMMUNITY): Payer: PRIVATE HEALTH INSURANCE

## 2013-06-25 DIAGNOSIS — O99891 Other specified diseases and conditions complicating pregnancy: Secondary | ICD-10-CM | POA: Diagnosis present

## 2013-06-25 DIAGNOSIS — O9989 Other specified diseases and conditions complicating pregnancy, childbirth and the puerperium: Secondary | ICD-10-CM

## 2013-06-25 DIAGNOSIS — O4690 Antepartum hemorrhage, unspecified, unspecified trimester: Principal | ICD-10-CM

## 2013-06-25 DIAGNOSIS — O4700 False labor before 37 completed weeks of gestation, unspecified trimester: Secondary | ICD-10-CM | POA: Diagnosis present

## 2013-06-25 DIAGNOSIS — O4693 Antepartum hemorrhage, unspecified, third trimester: Secondary | ICD-10-CM

## 2013-06-25 DIAGNOSIS — Z22330 Carrier of Group B streptococcus: Secondary | ICD-10-CM

## 2013-06-25 LAB — URINALYSIS, ROUTINE W REFLEX MICROSCOPIC
BILIRUBIN URINE: NEGATIVE
Glucose, UA: NEGATIVE mg/dL
Ketones, ur: NEGATIVE mg/dL
NITRITE: NEGATIVE
PH: 6 (ref 5.0–8.0)
Protein, ur: NEGATIVE mg/dL
UROBILINOGEN UA: 0.2 mg/dL (ref 0.0–1.0)

## 2013-06-25 LAB — CBC
HCT: 35.4 % — ABNORMAL LOW (ref 36.0–46.0)
HEMOGLOBIN: 12.1 g/dL (ref 12.0–15.0)
MCH: 30.3 pg (ref 26.0–34.0)
MCHC: 34.2 g/dL (ref 30.0–36.0)
MCV: 88.5 fL (ref 78.0–100.0)
Platelets: 209 10*3/uL (ref 150–400)
RBC: 4 MIL/uL (ref 3.87–5.11)
RDW: 13.8 % (ref 11.5–15.5)
WBC: 8.9 10*3/uL (ref 4.0–10.5)

## 2013-06-25 LAB — URINE MICROSCOPIC-ADD ON

## 2013-06-25 LAB — COMPREHENSIVE METABOLIC PANEL
ALT: 11 U/L (ref 0–35)
AST: 16 U/L (ref 0–37)
Albumin: 3 g/dL — ABNORMAL LOW (ref 3.5–5.2)
Alkaline Phosphatase: 169 U/L — ABNORMAL HIGH (ref 39–117)
BUN: 6 mg/dL (ref 6–23)
CALCIUM: 9.4 mg/dL (ref 8.4–10.5)
CO2: 22 meq/L (ref 19–32)
CREATININE: 0.49 mg/dL — AB (ref 0.50–1.10)
Chloride: 105 mEq/L (ref 96–112)
GLUCOSE: 80 mg/dL (ref 70–99)
Potassium: 4.1 mEq/L (ref 3.7–5.3)
SODIUM: 138 meq/L (ref 137–147)
TOTAL PROTEIN: 6.6 g/dL (ref 6.0–8.3)
Total Bilirubin: <0.2 mg/dL — ABNORMAL LOW (ref 0.3–1.2)

## 2013-06-25 LAB — DIC (DISSEMINATED INTRAVASCULAR COAGULATION)PANEL
D-Dimer, Quant: 0.8 ug/mL-FEU — ABNORMAL HIGH (ref 0.00–0.48)
Fibrinogen: 504 mg/dL — ABNORMAL HIGH (ref 204–475)
Smear Review: NONE SEEN

## 2013-06-25 LAB — TYPE AND SCREEN
ABO/RH(D): O POS
ANTIBODY SCREEN: NEGATIVE

## 2013-06-25 LAB — DIC (DISSEMINATED INTRAVASCULAR COAGULATION) PANEL
APTT: 27 s (ref 24–37)
INR: 0.96 (ref 0.00–1.49)
PLATELETS: 199 10*3/uL (ref 150–400)
PROTHROMBIN TIME: 12.6 s (ref 11.6–15.2)

## 2013-06-25 LAB — URIC ACID: URIC ACID, SERUM: 3.4 mg/dL (ref 2.4–7.0)

## 2013-06-25 LAB — OB RESULTS CONSOLE GBS: GBS: POSITIVE

## 2013-06-25 LAB — GROUP B STREP BY PCR: Group B strep by PCR: POSITIVE — AB

## 2013-06-25 MED ORDER — ACETAMINOPHEN 325 MG PO TABS
650.0000 mg | ORAL_TABLET | ORAL | Status: DC | PRN
  Administered 2013-06-25 – 2013-06-26 (×2): 650 mg via ORAL
  Filled 2013-06-25 (×2): qty 2

## 2013-06-25 MED ORDER — DOCUSATE SODIUM 100 MG PO CAPS
100.0000 mg | ORAL_CAPSULE | Freq: Every day | ORAL | Status: DC
  Administered 2013-06-25 – 2013-06-29 (×5): 100 mg via ORAL
  Filled 2013-06-25 (×5): qty 1

## 2013-06-25 MED ORDER — MAGNESIUM SULFATE BOLUS VIA INFUSION
4.0000 g | Freq: Once | INTRAVENOUS | Status: AC
  Administered 2013-06-25: 4 g via INTRAVENOUS
  Filled 2013-06-25: qty 500

## 2013-06-25 MED ORDER — MAGNESIUM SULFATE 40 G IN LACTATED RINGERS - SIMPLE
2.0000 g/h | INTRAVENOUS | Status: DC
  Administered 2013-06-25: 2 g/h via INTRAVENOUS
  Administered 2013-06-26: 2.5 g/h via INTRAVENOUS
  Administered 2013-06-27: 2 g/h via INTRAVENOUS
  Filled 2013-06-25 (×3): qty 500

## 2013-06-25 MED ORDER — CALCIUM CARBONATE ANTACID 500 MG PO CHEW: 2.0000 | CHEWABLE_TABLET | ORAL | Status: DC | PRN

## 2013-06-25 MED ORDER — PRENATAL MULTIVITAMIN CH
1.0000 | ORAL_TABLET | Freq: Every day | ORAL | Status: DC
  Administered 2013-06-25 – 2013-06-29 (×5): 1 via ORAL
  Filled 2013-06-25 (×5): qty 1

## 2013-06-25 MED ORDER — LACTATED RINGERS IV SOLN
INTRAVENOUS | Status: DC
  Administered 2013-06-25 – 2013-06-28 (×6): via INTRAVENOUS

## 2013-06-25 MED ORDER — LACTATED RINGERS IV SOLN
INTRAVENOUS | Status: DC
  Administered 2013-06-25: 12:00:00 via INTRAVENOUS

## 2013-06-25 MED ORDER — LACTATED RINGERS IV BOLUS (SEPSIS)
250.0000 mL | Freq: Once | INTRAVENOUS | Status: AC
  Administered 2013-06-25: 250 mL via INTRAVENOUS

## 2013-06-25 MED ORDER — ZOLPIDEM TARTRATE 5 MG PO TABS: 5.0000 mg | ORAL_TABLET | Freq: Every evening | ORAL | Status: DC | PRN

## 2013-06-25 MED ORDER — TERBUTALINE SULFATE 1 MG/ML IJ SOLN
0.2500 mg | Freq: Once | INTRAMUSCULAR | Status: AC
  Administered 2013-06-25: 0.25 mg via SUBCUTANEOUS
  Filled 2013-06-25: qty 1

## 2013-06-25 NOTE — Plan of Care (Signed)
Problem: Consults Goal: Birthing Suites Patient Information Press F2 to bring up selections list   Pt < [redacted] weeks EGA     

## 2013-06-25 NOTE — H&P (Signed)
Laurie [name] is a 28 y.o. female presenting for C/O vaginal bleeding starting this am at work. Not feeling UCs. No fever or HA. No ROM. Transferred to MAU and received IV fluids and SQ terbutaline x 1. UCs persisted. Labs and U/S OK. Final U/S report pending.  Maternal Medical History:  Reason for admission: Vaginal bleeding.   Fetal activity: Perceived fetal activity is normal.      OB History   Grav Para Term Preterm Abortions TAB SAB Ect Mult Living   3 0 0 0 2 0 2 0 0 0      Past Medical History  Diagnosis Date  . No pertinent past medical history   . Medical history non-contributory    Past Surgical History  Procedure Laterality Date  . Tonsillectomy    . Wisdom tooth extraction    . Dilation and evacuation  05/10/2012    Procedure: DILATATION AND EVACUATION;  Surgeon: Juluis MireJohn S McComb, MD;  Location: [facility];  Service: Gynecology;  Laterality: N/A;  . Dilation and evacuation N/A 08/07/2012    Procedure: DILATATION AND EVACUATION WITH GENETIC STUDIES;  Surgeon: Juluis MireJohn S McComb, MD;  Location: [facility];  Service: Gynecology;  Laterality: N/A;  chromosome studies  . Dilation and curettage of uterus     Family History: family history includes Asthma in her maternal aunt; COPD in her maternal aunt; Cancer in her maternal aunt; Diabetes in her maternal grandmother; Hyperlipidemia in her father; Hypertension in her father and mother; Varicose Veins in her maternal aunt. Social History:  reports that she has never smoked. She has never used smokeless tobacco. She reports that she does not drink alcohol or use illicit drugs.   Prenatal Transfer Tool  Maternal Diabetes: No Genetic Screening: Normal Maternal Ultrasounds/Referrals: Normal Fetal Ultrasounds or other Referrals:  None Maternal Substance Abuse:  No Significant Maternal Medications:  None Significant Maternal Lab Results:  None Other Comments:  None  Review of Systems  Constitutional: Negative for fever.  Eyes: Negative for  blurred vision.  Gastrointestinal: Negative for abdominal pain.  Neurological: Negative for headaches.      Blood pressure 113/70, pulse 90, temperature 98.4 F (36.9 C), temperature source Oral, resp. rate 18, height 5\' 4"  (1.626 m), weight 157 lb (71.215 kg), last menstrual period 10/28/2012, SpO2 100.00%.   Fetal Exam Fetal State Assessment: Category I - tracings are normal.     Physical Exam  Cardiovascular: Normal rate and regular rhythm.   Respiratory: Effort normal and breath sounds normal.  GI: Soft. There is no tenderness.  Neurological:  DTR 1+ on magnesium sulfate   Cx 1-2 / th / soft in office Prenatal labs: ABO, Rh: --/--/O POS (02/23 1040) Antibody: NEG (02/23 1040) Rubella:   RPR:    HBsAg:    HIV:    GBS: Positive (02/23 0000)   Assessment/Plan: 28 yo G3P0 at 8134 2/7 weeks with PTL Now on magnesium sulfate and UCs about 4-5/hour on 2 gm/hr D/W patient and husband above Will continue magnesium through tonight.   Penelope Fittro II,Xyler Terpening E 06/25/2013, 8:51 PM

## 2013-06-25 NOTE — Progress Notes (Signed)
Ur chart review completed.  

## 2013-06-25 NOTE — MAU Note (Signed)
Pt states began bleeding once she got to work this am, had gush of blood and saw a clot. Back is achy, however is not feeling contractions.

## 2013-06-25 NOTE — MAU Provider Note (Signed)
History     CSN: [id number]  Arrival date and time: 06/25/13 1021   First Provider Initiated Contact with Patient 06/25/13 1100      Chief Complaint  Patient presents with  . Vaginal Bleeding   HPI This is a 28 y.o. female at [redacted]w[redacted]d who was sent from Dr Tomblin's office with third trimester bleeding. Has contractions but only feels them in her back. Was at work and had moderately heavy bleeding. Does feel fetal movement. [place] was ordered from office and is being done immediately upon her arrival.   RN Note:  Pt states began bleeding once she got to work this am, had gush of blood and saw a clot. Back is achy, however is not feeling contractions.        OB History   Grav Para Term Preterm Abortions TAB SAB Ect Mult Living   3 0 0 0 2 0 2 0 0 0       Past Medical History  Diagnosis Date  . No pertinent past medical history   . Medical history non-contributory     Past Surgical History  Procedure Laterality Date  . Tonsillectomy    . Wisdom tooth extraction    . Dilation and evacuation  05/10/2012    Procedure: DILATATION AND EVACUATION;  Surgeon: [name], MD;  Location: [facility];  Service: Gynecology;  Laterality: N/A;  . Dilation and evacuation N/A 08/07/2012    Procedure: DILATATION AND EVACUATION WITH GENETIC STUDIES;  Surgeon: [name], MD;  Location: [facility];  Service: Gynecology;  Laterality: N/A;  chromosome studies    No family history on file.  History  Substance Use Topics  . Smoking status: Never Smoker   . Smokeless tobacco: Never Used  . Alcohol Use: No    Allergies:  Allergies  Allergen Reactions  . Sulfa Antibiotics Hives and Other (See Comments)    Causes paralysis    Prescriptions prior to admission  Medication Sig Dispense Refill  . aspirin EC 81 MG tablet Take 81 mg by mouth daily.      . folic acid (FOLVITE) 400 MCG tablet Take 400 mcg by mouth at bedtime.      . Prenatal Vit-Fe Fumarate-FA (PRENATAL MULTIVITAMIN) TABS tablet Take 1  tablet by mouth at bedtime.        Review of Systems  Constitutional: Negative for fever, chills and malaise/fatigue.  Gastrointestinal: Negative for nausea, vomiting and abdominal pain.  Genitourinary: Negative for dysuria.  Musculoskeletal: Positive for back pain.  Neurological: Negative for dizziness and headaches.   Physical Exam   Last menstrual period 10/28/2012.  Physical Exam  Constitutional: She is oriented to person, place, and time. She appears well-developed and well-nourished. No distress.  HENT:  Head: Normocephalic.  Cardiovascular: Normal rate and regular rhythm.  Exam reveals no gallop and no friction rub.   No murmur heard. Respiratory: Effort normal. No respiratory distress. She has no wheezes. She has no rales.  GI: Soft. She exhibits no distension. There is no tenderness. There is no rebound and no guarding.  Genitourinary: Uterus normal. Vaginal discharge (Speculum exam:  Moderate blood in vault, no clots. ) found.   Cervix 1+/50%/-3/vtx Blood was watery so I did a fern slide, negative ferning   Musculoskeletal: Normal range of motion.  Neurological: She is alert and oriented to person, place, and time.  Skin: Skin is warm and dry.  Psychiatric: She has a normal mood and affect.   FHR reactive UCs irregular, every  2-7 minutes  MAU Course  Procedures  MDM US done on arrival  >>  SIUP, placenta posterior, marginal cord insertion, AFI 10.63, cervix not visualized, anatomy normal. No evidence of abruption.    Discussed with Dr Henderson Cloudomblin who ordered Terbutaline for tocolysis.  >>  Did not stop contractions. Dr Henderson Cloudomblin notified.  He will call us back with plan.  Assessment and Plan  A:  SIUP at 5067w2d        Third trimester bleeding  P:  Plan per Dr Jose Persiaomblin  Laurie [name] 06/25/2013, 11:45 AM

## 2013-06-25 NOTE — Progress Notes (Signed)
CRITICAL VALUE ALERT  Critical value received:  GBS +  Date of notification:  06/25/2013  Time of notification:  1328  Critical value read back:yes  Nurse who received alert:  Lundon Rosier R. Roxan Hockeyobinson RN  MD notified (1st page):  [phone], in department, M. Williams CNM  Time of first page:  n/a  Responding MD:  Artelia LarocheM. Williams CNM  Time MD responded:  [phone]

## 2013-06-26 ENCOUNTER — Encounter (HOSPITAL_COMMUNITY): Payer: Self-pay | Admitting: Advanced Practice Midwife

## 2013-06-26 DIAGNOSIS — A491 Streptococcal infection, unspecified site: Secondary | ICD-10-CM | POA: Insufficient documentation

## 2013-06-26 MED ORDER — SODIUM CHLORIDE 0.9 % IV SOLN
3.0000 g | Freq: Four times a day (QID) | INTRAVENOUS | Status: DC
  Administered 2013-06-26 – 2013-06-28 (×8): 3 g via INTRAVENOUS
  Filled 2013-06-26 (×10): qty 3

## 2013-06-26 NOTE — Progress Notes (Signed)
Patient ID: Laurie PerchesAshley M Belmontes, female   DOB: 1986/04/13, 28 y.o.   MRN: [id number] Pt still with spotting but improved since yesterday Still feels 6-8 ctxs /h  VSSAF FHR 140s Cat 1 Ctxs 6-8 x/h  Abd:  Gravid, nt Neg homans Bil  IUP at 34 3/7 PTL - will increase mag to achieve quisceince IV unasyn for GBS Vag bleeding improved- most likely bloody show DL

## 2013-06-27 MED ORDER — LIDOCAINE 1%/NA BICARB 0.1 MEQ INJECTION
INJECTION | INTRAVENOUS | Status: AC
  Filled 2013-06-27: qty 1

## 2013-06-27 NOTE — Progress Notes (Deleted)
Ur chart review completed.  

## 2013-06-27 NOTE — Progress Notes (Signed)
S: Patient is doing well. Had small episode of bright red VB around lunchtime today and because of that I kept her on 2 gram of Magnesium today. O:  Afebrile VSS Abdomen is soft and non tender FHR is Category 1 Toco  No UCs  IMPRESSION: IUP at 34 w 4 days PTL Vaginal bleeding  I suspect placental abruption  PLAN: Possible discontinue Magnesium tomorrow am Continue observation in hospital for PTL/ Return of vaginal bleeding She lives one hour away

## 2013-06-27 NOTE — Progress Notes (Signed)
S: Patient is feeling much better. About to take a shower. No further bleeding or contractions.  O: afebrile VSS General alert and oriented Lung CTAB Car RRR Abdomen is soft and non tender  IMPRESSION: Preterm LABOR GBBS +  PLAN: Decrease Magnesium to 2 gram per hour Try to discontinue the magnesium today Possible discharge home tomorrow

## 2013-06-27 NOTE — Progress Notes (Signed)
Ur chart review completed per request.  

## 2013-06-28 MED ORDER — SODIUM CHLORIDE 0.9 % IJ SOLN
3.0000 mL | Freq: Two times a day (BID) | INTRAMUSCULAR | Status: DC
  Administered 2013-06-28 – 2013-06-29 (×3): 3 mL via INTRAVENOUS

## 2013-06-28 MED ORDER — SODIUM CHLORIDE 0.9 % IJ SOLN
3.0000 mL | INTRAMUSCULAR | Status: DC | PRN
Start: 2013-06-28 — End: 2013-06-29

## 2013-06-28 NOTE — Progress Notes (Signed)
No new complaints.  Last vaginal bleeding yesterday.  No CTX, +FM.   VSS. AF Gen: A&O x 3 Abd: soft, NT Ext: no c/c/e  FHT: Cat I Toco: flat  28yo G3P0 at 4113w5d with bleeding in the third trimester -Will d/c Mag -Monitor for stability with possible d/c home tomorrow

## 2013-06-28 NOTE — Progress Notes (Signed)
Pt off the monitor and up to shower

## 2013-06-29 ENCOUNTER — Inpatient Hospital Stay (HOSPITAL_COMMUNITY): Payer: PRIVATE HEALTH INSURANCE

## 2013-06-29 NOTE — Progress Notes (Signed)
Patient ID: Laurie PerchesAshley M Wirtanen, female   DOB: 10-Jul-1985, 28 y.o.   MRN: [id number] No further bleeding or ctx's sono bpp 8/8  No abrution. A succenturiate lobe was noted. Amniotic fluid was normal. She'll be discharged home at this point in time.

## 2013-06-29 NOTE — Progress Notes (Signed)
Patient ID: Laurie PerchesAshley M Najarro, female   DOB: July 08, 1985, 28 y.o.   MRN: [id number] S:  SOME BLEEDING AND CLOTS YESTERDAY O: AF VSS      GRAVID UTERUS NONTENDER      FHR CAT ONE      CERVIX POST LONG AND FINGERTIP      BROWNISH DISCHARGE A:  IUP AT 34.6      VAGINAL BLEEDIING      PRETERM CONTRACTIONS OFF MAGNESIUM P:  CHECK SONO IF OK AND NO BLEEDING D/C HOME

## 2013-06-29 NOTE — Discharge Instructions (Signed)
Vaginal Bleeding During Pregnancy A small amount of bleeding from the vagina can happen anytime during pregnancy. It usually stops on its own. However, some bleeding can be serious. Be sure to tell your doctor about all vaginal bleeding. HOME CARE  Get plenty of rest and sleep.  Stay in bed and only get up to go to the bathroom as told by your doctor.  Write down the number of pads you use each day. Note how soaked they are.  Do not use tampons. Do not clean the vagina with a stream of water (douche).  Do not have sex (intercourse) or put anything into your vagina. Have this approved by your doctor.  Save any tissue that comes from your vagina. Show it to your doctor.  Only take medicine as told by your doctor.  Follow your doctor's advice about lifting, driving, and physical activity. GET HELP RIGHT AWAY IF:   You feel your baby move less or not at all.  You pass out (faint) while going to the bathroom.  You have more bleeding.  You start to have contractions.  You have severe cramps in your stomach, back, or belly (abdomen).  You are leaking fluid or have a gush of fluid from your vagina.  You become lightheaded or weak.  You have chills.  You have clumps of tissue or blood clots coming from your vagina.  You have a fever. MAKE SURE YOU:   Understand these instructions.  Will watch your condition.  Will get help right away if you are not doing well or get worse. Document Released: 01/27/2008 Document Revised: 04/05/2012 Document Reviewed: 02/07/2012 [facility] Patient Information 2014 [place], [place].  Preterm Birth Preterm birth is a birth that happens before 37 weeks of pregnancy. Most pregnancies last about 39 41 weeks. Every week in the womb is important and is beneficial to the health of the infant. Infants born before 37 weeks of pregnancy are at a higher risk for complications. Depending on when the infant was born, he or she may be:  Late preterm. Born  between 32 weeks and 37 weeks of pregnancy.  Very preterm. Born at less than 32 weeks of pregnancy.  Extremely preterm. Born at less than 25 weeks of pregnancy. The earlier a baby is born, the more likely the child will have issues related to prematurity. Complications and problems that can be seen in infants born too early include:  Problems breathing (respiratory distress syndrome).  Low birth weight.  Problems feeding.  Sleeping problems.  Yellowing of the skin (jaundice).  Infections such as pneumonia. Babies born very preterm or extremely preterm are at risk for more serious medical issues. These include:  More severe breathing issues.  Eyesight issues.  Brain development issues (intraventricular hemorrhage).  Behavioral and emotional development issues.  Growth and developmental delays.  Cerebral palsy.  Serious feeding or bowel complications (necrotizing enterocolitis). CAUSES  There are two broad categories of preterm birth.  Spontaneous preterm birth. This is a birth resulting from preterm labor (not medically induced) or preterm premature rupture of membranes (PPROM).  Indicated preterm birth. This is a birth resulting from labor being medically induced due to health, personal, or social reasons. RISK FACTORS Preterm birth may be related to certain medical conditions, lifestyle factors, or demographic factors encountered by the mother or fetus.  Medical conditions include:  Multiple gestations (twins, triplets, and so on).  Infection.  Diabetes.  Heart disease.  Kidney disease.  Cervical or uterine abnormalities.  Being underweight.  High blood pressure or preeclampsia.  Premature rupture of membranes (PROM).  Birth defects in the fetus.  Lifestyle factors include:  Poor prenatal care.  Poor nutrition or anemia.  Cigarette smoking.  Consuming alcohol.  High levels of stress and lack of social or emotional support.  Exposure to  chemical or environmental toxins.  Substance abuse.  Demographic factors include:  African-American ethnicity.  Age (younger than 9818 or older than 30 years of age).  Low socioeconomic status. Women with a history of preterm labor or who become pregnant within 3518 months of giving birth are also at increased risk for preterm birth. DIAGNOSIS  Your health care provider may request additional tests to diagnose underlying complications resulting from preterm birth. Tests on the infant may include:  Physical exam.  Blood tests.  Chest X-rays.  Heart-lung monitoring. TREATMENT  After birth, special care will be taken to assess any problems or complications for the infant. Supportive care will be provided for the infant. Treatment depends on what problems are present and any complications that develop. Some preterm infants are cared for in a neonatal intensive care unit. In general, care may include:  Maintaining temperature and oxygen in a clear heated box (baby isolette).  Monitoring the infant's heart rate, breathing, and level of oxygen in the blood.  Monitoring for signs of infection and, if needed, giving IV antibiotic medicine.  Inserting a feeding tube (nose, mouth) or giving IV nutrition if unable to feed.  Inserting a breathing tube (ventilation).  Respiration support (continuous positive airway pressure [CPAP] or oxygen). Treatment will change as the infant builds up strength and is able to breathe and eat on his or her own. For some infants, no special treatment is necessary. Parents may be educated on the potential health risks of prematurity to the infant. HOME CARE INSTRUCTIONS  Understand your infant's special conditions and needs. It may be reassuring to learn about infant CPR.  Monitor your infant in the car seat until he or she grows and matures. Infant car seats can cause breathing difficulties for preterm infants.  Keep your infant warm. Dress your infant in  layers and keep him or her away from drafts, especially in cold months of the year.  Wash your hands thoroughly after going to the bathroom or changing a diaper. Late preterm infants may be more prone to infection.  Follow all your health care provider's instructions for providing support and care to your preterm infant.  Get support from organizations and groups that understand your challenges.  Follow up with your infant's health care provider as directed. Prevention There are some things you can do to help lower your risk of having a preterm infant in the future. These include:  Good prenatal care throughout the entire pregnancy. See a health care provider regularly for advice and tests.  Management of underlying medical conditions.  Proper self-care and lifestyle changes.  Proper diet and weight control.  Watching for signs of various infections. SEEK MEDICAL CARE IF:  Your infant has feeding difficulties.  Your infant has sleeping difficulties.  Your infant has breathing difficulties.  Your infant's skin starts to look yellow.  Your infant shows signs of infection, such as a stuffy nose, fever, crying, or bluish color of the skin. FOR MORE INFORMATION March of D[employer]: www.marchofdimes.com Prematurity.org: www.prematurity.org Document Released: 07/10/2003 Document Revised: 02/07/2013 Document Reviewed: 11/16/2012 [facility] Patient Information 2014 [place]

## 2013-06-29 NOTE — Discharge Summary (Signed)
Admitting diagnosis: Intrauterine pregnancy at 34-2/7 with preterm uterine activity and vaginal bleeding.  Discharge diagnosis: Is the same with resolution of uterine activity with Tocolysis  Complete history and physical please see dictated note.  Course in the hospital: Patient was brought in the hospital. After failing subcutaneous terbutaline she was started on magnesium sulfate. An ultrasound was performed which showed no evidence of any abruption. She was gradually weaned off the magnesium sulfate. She was off magnesium sulfate yesterday. This morning she was having no uterine activity cervix at my exam was long and closed. No blood was noted. Repeat her ultrasound. She did have a succenturiate lobe that we had not noticed before. The missile traveled to the membranes in the fundal area of the uterus. There was no evidence of a placenta previa or abruption. Biophysical profile was 8 out of 8. She'll be discharged home at this time.  Terms of complications: There were none.  Is discharged home in stable condition.  Disposition: Patient could be a relative rest. She will not work until we see her back the office next Wednesday. Should call with increasing uterine activity or vaginal bleeding or any type of increasing pain.

## 2013-06-29 NOTE — Progress Notes (Signed)
Pt off the monitor after reassurring FHR  

## 2013-06-29 NOTE — Progress Notes (Signed)
Pink after void, scant amt

## 2013-07-27 ENCOUNTER — Inpatient Hospital Stay (HOSPITAL_COMMUNITY)
Admission: AD | Admit: 2013-07-27 | Discharge: 2013-07-29 | DRG: 775 | Disposition: A | Discharge status: Home / Self Care | Payer: PRIVATE HEALTH INSURANCE | Source: Ambulatory Visit | Attending: Obstetrics and Gynecology | Admitting: Obstetrics and Gynecology

## 2013-07-27 ENCOUNTER — Encounter (HOSPITAL_COMMUNITY): Payer: Self-pay | Admitting: *Deleted

## 2013-07-27 ENCOUNTER — Telehealth (HOSPITAL_COMMUNITY): Payer: Self-pay | Admitting: *Deleted

## 2013-07-27 ENCOUNTER — Inpatient Hospital Stay (HOSPITAL_COMMUNITY)
Admission: AD | Admit: 2013-07-27 | Discharge: 2013-07-27 | Disposition: A | Discharge status: Home / Self Care | Payer: PRIVATE HEALTH INSURANCE | Source: Ambulatory Visit | Attending: Obstetrics and Gynecology | Admitting: Obstetrics and Gynecology

## 2013-07-27 DIAGNOSIS — O99892 Other specified diseases and conditions complicating childbirth: Principal | ICD-10-CM | POA: Diagnosis present

## 2013-07-27 DIAGNOSIS — O9989 Other specified diseases and conditions complicating pregnancy, childbirth and the puerperium: Principal | ICD-10-CM

## 2013-07-27 DIAGNOSIS — Z22330 Carrier of Group B streptococcus: Secondary | ICD-10-CM

## 2013-07-27 DIAGNOSIS — O479 False labor, unspecified: Secondary | ICD-10-CM | POA: Insufficient documentation

## 2013-07-27 NOTE — Discharge Instructions (Signed)
Keep your scheduled appointment for prenatal care. Call the office or nurse on call with further concerns or return to MAU as needed. Drink 8-10 glasses of water per day. Make sure the baby is moving well everyday. Dr. Marcelle OverlieHolland was going to pull your chart and see about scheduling you for induction on Monday. The office will call you if they are able to schedule.

## 2013-07-27 NOTE — MAU Note (Signed)
Contractions since 0100. Seen here earlier today and was 3cm. About 2200 felt a pop and leaked a gush of fld and cont to leak cl fld. Contractions stronger now. GBS positive

## 2013-07-27 NOTE — Telephone Encounter (Signed)
Preadmission screen  

## 2013-07-27 NOTE — MAU Note (Signed)
Pt C/O uc's since 0100, some bloody show, denies LOF.  Was 3 cm's in MD office yesterday.

## 2013-07-28 ENCOUNTER — Encounter (HOSPITAL_COMMUNITY): Payer: PRIVATE HEALTH INSURANCE | Admitting: Anesthesiology

## 2013-07-28 ENCOUNTER — Encounter (HOSPITAL_COMMUNITY): Payer: Self-pay | Admitting: *Deleted

## 2013-07-28 ENCOUNTER — Inpatient Hospital Stay (HOSPITAL_COMMUNITY): Payer: PRIVATE HEALTH INSURANCE | Admitting: Anesthesiology

## 2013-07-28 LAB — TYPE AND SCREEN
ABO/RH(D): O POS
Antibody Screen: NEGATIVE

## 2013-07-28 LAB — CBC
HEMATOCRIT: 35.7 % — AB (ref 36.0–46.0)
Hemoglobin: 12 g/dL (ref 12.0–15.0)
MCH: 30.6 pg (ref 26.0–34.0)
MCHC: 33.6 g/dL (ref 30.0–36.0)
MCV: 91.1 fL (ref 78.0–100.0)
PLATELETS: 209 10*3/uL (ref 150–400)
RBC: 3.92 MIL/uL (ref 3.87–5.11)
RDW: 14.2 % (ref 11.5–15.5)
WBC: 12.6 10*3/uL — ABNORMAL HIGH (ref 4.0–10.5)

## 2013-07-28 LAB — RPR: RPR Ser Ql: NONREACTIVE

## 2013-07-28 MED ORDER — LACTATED RINGERS IV SOLN
INTRAVENOUS | Status: DC
  Administered 2013-07-28: 01:00:00 via INTRAVENOUS

## 2013-07-28 MED ORDER — OXYTOCIN BOLUS FROM INFUSION
500.0000 mL | INTRAVENOUS | Status: DC
  Administered 2013-07-28: 500 mL via INTRAVENOUS

## 2013-07-28 MED ORDER — ONDANSETRON HCL 4 MG/2ML IJ SOLN: 4.0000 mg | Freq: Four times a day (QID) | INTRAMUSCULAR | Status: DC | PRN

## 2013-07-28 MED ORDER — PHENYLEPHRINE 40 MCG/ML (10ML) SYRINGE FOR IV PUSH (FOR BLOOD PRESSURE SUPPORT)
80.0000 ug | PREFILLED_SYRINGE | INTRAVENOUS | Status: DC | PRN
  Filled 2013-07-28: qty 10
  Filled 2013-07-28: qty 2

## 2013-07-28 MED ORDER — ONDANSETRON HCL 4 MG/2ML IJ SOLN: 4.0000 mg | INTRAMUSCULAR | Status: DC | PRN

## 2013-07-28 MED ORDER — PENICILLIN G POTASSIUM 5000000 UNITS IJ SOLR
5.0000 10*6.[IU] | Freq: Once | INTRAVENOUS | Status: AC
  Administered 2013-07-28: 5 10*6.[IU] via INTRAVENOUS
  Filled 2013-07-28: qty 5

## 2013-07-28 MED ORDER — OXYCODONE-ACETAMINOPHEN 5-325 MG PO TABS: 1.0000 | ORAL_TABLET | ORAL | Status: DC | PRN

## 2013-07-28 MED ORDER — ZOLPIDEM TARTRATE 5 MG PO TABS: 5.0000 mg | ORAL_TABLET | Freq: Every evening | ORAL | Status: DC | PRN

## 2013-07-28 MED ORDER — OXYCODONE-ACETAMINOPHEN 5-325 MG PO TABS: 1.0000 | ORAL_TABLET | Freq: Four times a day (QID) | ORAL | Status: DC | PRN

## 2013-07-28 MED ORDER — EPHEDRINE 5 MG/ML INJ
10.0000 mg | INTRAVENOUS | Status: DC | PRN
  Filled 2013-07-28: qty 2
  Filled 2013-07-28: qty 4

## 2013-07-28 MED ORDER — CITRIC ACID-SODIUM CITRATE 334-500 MG/5ML PO SOLN: 30.0000 mL | ORAL | Status: DC | PRN

## 2013-07-28 MED ORDER — SENNOSIDES-DOCUSATE SODIUM 8.6-50 MG PO TABS
2.0000 | ORAL_TABLET | ORAL | Status: DC
  Administered 2013-07-28: 2 via ORAL
  Filled 2013-07-28: qty 2

## 2013-07-28 MED ORDER — LIDOCAINE HCL (PF) 1 % IJ SOLN
30.0000 mL | INTRAMUSCULAR | Status: DC | PRN
  Administered 2013-07-28: 30 mL via SUBCUTANEOUS
  Filled 2013-07-28: qty 30

## 2013-07-28 MED ORDER — EPHEDRINE 5 MG/ML INJ
10.0000 mg | INTRAVENOUS | Status: DC | PRN
  Filled 2013-07-28: qty 2

## 2013-07-28 MED ORDER — SIMETHICONE 80 MG PO CHEW: 80.0000 mg | CHEWABLE_TABLET | ORAL | Status: DC | PRN

## 2013-07-28 MED ORDER — WITCH HAZEL-GLYCERIN EX PADS: 1.0000 "application " | MEDICATED_PAD | CUTANEOUS | Status: DC | PRN

## 2013-07-28 MED ORDER — FLEET ENEMA 7-19 GM/118ML RE ENEM: 1.0000 | ENEMA | Freq: Every day | RECTAL | Status: DC | PRN

## 2013-07-28 MED ORDER — IBUPROFEN 800 MG PO TABS
800.0000 mg | ORAL_TABLET | Freq: Three times a day (TID) | ORAL | Status: DC | PRN
  Administered 2013-07-28 – 2013-07-29 (×4): 800 mg via ORAL
  Filled 2013-07-28 (×4): qty 1

## 2013-07-28 MED ORDER — IBUPROFEN 600 MG PO TABS: 600.0000 mg | ORAL_TABLET | Freq: Four times a day (QID) | ORAL | Status: DC | PRN

## 2013-07-28 MED ORDER — TETANUS-DIPHTH-ACELL PERTUSSIS 5-2.5-18.5 LF-MCG/0.5 IM SUSP: 0.5000 mL | Freq: Once | INTRAMUSCULAR | Status: DC

## 2013-07-28 MED ORDER — ACETAMINOPHEN 325 MG PO TABS: 650.0000 mg | ORAL_TABLET | ORAL | Status: DC | PRN

## 2013-07-28 MED ORDER — DIBUCAINE 1 % RE OINT: 1.0000 "application " | TOPICAL_OINTMENT | RECTAL | Status: DC | PRN

## 2013-07-28 MED ORDER — BENZOCAINE-MENTHOL 20-0.5 % EX AERO
1.0000 "application " | INHALATION_SPRAY | CUTANEOUS | Status: DC | PRN
  Administered 2013-07-28: 1 via TOPICAL
  Filled 2013-07-28: qty 56

## 2013-07-28 MED ORDER — OXYTOCIN 40 UNITS IN LACTATED RINGERS INFUSION - SIMPLE MED
62.5000 mL/h | INTRAVENOUS | Status: DC
  Filled 2013-07-28: qty 1000

## 2013-07-28 MED ORDER — ONDANSETRON HCL 4 MG PO TABS: 4.0000 mg | ORAL_TABLET | ORAL | Status: DC | PRN

## 2013-07-28 MED ORDER — PRENATAL MULTIVITAMIN CH
1.0000 | ORAL_TABLET | Freq: Every day | ORAL | Status: DC
  Administered 2013-07-28 – 2013-07-29 (×2): 1 via ORAL
  Filled 2013-07-28 (×2): qty 1

## 2013-07-28 MED ORDER — LACTATED RINGERS IV SOLN
500.0000 mL | Freq: Once | INTRAVENOUS | Status: AC
  Administered 2013-07-28: 500 mL via INTRAVENOUS

## 2013-07-28 MED ORDER — BISACODYL 10 MG RE SUPP: 10.0000 mg | Freq: Every day | RECTAL | Status: DC | PRN

## 2013-07-28 MED ORDER — PHENYLEPHRINE 40 MCG/ML (10ML) SYRINGE FOR IV PUSH (FOR BLOOD PRESSURE SUPPORT)
80.0000 ug | PREFILLED_SYRINGE | INTRAVENOUS | Status: DC | PRN
  Filled 2013-07-28: qty 2

## 2013-07-28 MED ORDER — PENICILLIN G POTASSIUM 5000000 UNITS IJ SOLR
2.5000 10*6.[IU] | INTRAVENOUS | Status: DC
  Administered 2013-07-28: 2.5 10*6.[IU] via INTRAVENOUS
  Filled 2013-07-28 (×4): qty 2.5

## 2013-07-28 MED ORDER — LACTATED RINGERS IV SOLN: 500.0000 mL | INTRAVENOUS | Status: DC | PRN

## 2013-07-28 MED ORDER — LIDOCAINE HCL (PF) 1 % IJ SOLN
INTRAMUSCULAR | Status: DC | PRN
  Administered 2013-07-28 (×2): 5 mL

## 2013-07-28 MED ORDER — FLEET ENEMA 7-19 GM/118ML RE ENEM: 1.0000 | ENEMA | RECTAL | Status: DC | PRN

## 2013-07-28 MED ORDER — FENTANYL 2.5 MCG/ML BUPIVACAINE 1/10 % EPIDURAL INFUSION (WH - ANES)
14.0000 mL/h | INTRAMUSCULAR | Status: DC | PRN
  Administered 2013-07-28: 14 mL/h via EPIDURAL
  Filled 2013-07-28: qty 125

## 2013-07-28 MED ORDER — MEASLES, MUMPS & RUBELLA VAC ~~LOC~~ INJ
0.5000 mL | INJECTION | Freq: Once | SUBCUTANEOUS | Status: DC
  Filled 2013-07-28: qty 0.5

## 2013-07-28 MED ORDER — DIPHENHYDRAMINE HCL 50 MG/ML IJ SOLN: 12.5000 mg | INTRAMUSCULAR | Status: DC | PRN

## 2013-07-28 MED ORDER — LANOLIN HYDROUS EX OINT: TOPICAL_OINTMENT | CUTANEOUS | Status: DC | PRN

## 2013-07-28 MED ORDER — DIPHENHYDRAMINE HCL 25 MG PO CAPS: 25.0000 mg | ORAL_CAPSULE | Freq: Four times a day (QID) | ORAL | Status: DC | PRN

## 2013-07-28 NOTE — Anesthesia Preprocedure Evaluation (Signed)

## 2013-07-28 NOTE — H&P (Signed)
Laurie [name] is a 28 y.o. female presenting for SOL. Maternal Medical History:  Reason for admission: Contractions.   Contractions: Onset was 6-12 hours ago.    Fetal activity: Perceived fetal activity is normal.      OB History   Grav Para Term Preterm Abortions TAB SAB Ect Mult Living   3 0 0 0 2 0 2 0 0 0      Past Medical History  Diagnosis Date  . No pertinent past medical history   . Medical history non-contributory    Past Surgical History  Procedure Laterality Date  . Tonsillectomy    . Wisdom tooth extraction    . Dilation and evacuation  05/10/2012    Procedure: DILATATION AND EVACUATION;  Surgeon: Juluis MireJohn S McComb, MD;  Location: [facility];  Service: Gynecology;  Laterality: N/A;  . Dilation and evacuation N/A 08/07/2012    Procedure: DILATATION AND EVACUATION WITH GENETIC STUDIES;  Surgeon: Juluis MireJohn S McComb, MD;  Location: [facility];  Service: Gynecology;  Laterality: N/A;  chromosome studies  . Dilation and curettage of uterus     Family History: family history includes Asthma in her maternal aunt; COPD in her maternal aunt; Cancer in her maternal aunt; Diabetes in her maternal grandmother; Hyperlipidemia in her father; Hypertension in her father and mother; Varicose Veins in her maternal aunt. Social History:  reports that she has never smoked. She has never used smokeless tobacco. She reports that she does not drink alcohol or use illicit drugs.   Prenatal Transfer Tool  Maternal Diabetes: No Genetic Screening: Normal Maternal Ultrasounds/Referrals: Normal Fetal Ultrasounds or other Referrals:  None Maternal Substance Abuse:  No Significant Maternal Medications:  None Significant Maternal Lab Results:  None Other Comments:  None  ROS  Dilation: 10 Effacement (%): 100 Station: +1 Exam by:: nicole licato rn Blood pressure 126/74, pulse 104, temperature 98.1 F (36.7 C), temperature source Oral, resp. rate 18, height 5\' 4"  (1.626 [name]), weight 167 lb 3.2 oz (75.841 kg),  last menstrual period 10/28/2012. Maternal Exam:  Uterine Assessment: Contraction strength is moderate.  Contraction frequency is regular.   Abdomen: Fundal height is term FH.   Estimated fetal weight is AGA.   Fetal presentation: vertex  Introitus: Normal vulva. Normal vagina.  Pelvis: adequate for delivery.   Cervix: Cervix evaluated by digital exam.     Physical Exam  Constitutional: She is oriented to person, place, and time. She appears well-developed and well-nourished.  HENT:  Head: Normocephalic and atraumatic.  Neck: Normal range of motion. Neck supple.  Cardiovascular: Normal rate and regular rhythm.   Respiratory: Effort normal and breath sounds normal.  GI:  Term FH/FHR 148  Genitourinary:  6/C/clr AF  Musculoskeletal: Normal range of motion.  Neurological: She is alert and oriented to person, place, and time.    Prenatal labs: ABO, Rh: --/--/O POS (02/23 1040) Antibody: NEG (02/23 1040) Rubella: Immune (08/22 0000) RPR: Nonreactive (08/22 0000)  HBsAg: Negative (08/22 0000)  HIV: Non-reactive (08/22 0000)  GBS: Positive (02/23 0000)   Assessment/Plan: Term Preg/SOL, hx secondary placental lobe   Laurie [name] 07/28/2013, 3:42 AM

## 2013-07-28 NOTE — Progress Notes (Signed)
Dr. Marcelle OverlieHolland would like us to continue pushing.

## 2013-07-28 NOTE — Anesthesia Procedure Notes (Signed)
Epidural Patient location during procedure: OB Start time: 07/28/2013 1:41 AM  Staffing Anesthesiologist: Brayton CavesJACKSON, Nayelly Laughman Performed by: anesthesiologist   Preanesthetic Checklist Completed: patient identified, site marked, surgical consent, pre-op evaluation, timeout performed, IV checked, risks and benefits discussed and monitors and equipment checked  Epidural Patient position: sitting Prep: site prepped and draped and DuraPrep Patient monitoring: continuous pulse ox and blood pressure Approach: midline Injection technique: LOR air  Needle:  Needle type: Tuohy  Needle gauge: 17 G Needle length: 9 cm and 9 Needle insertion depth: 5 cm cm Catheter type: closed end flexible Catheter size: 19 Gauge Catheter at skin depth: 10 cm Test dose: negative  Assessment Events: blood not aspirated, injection not painful, no injection resistance, negative IV test and no paresthesia  Additional Notes Patient identified.  Risk benefits discussed including failed block, incomplete pain control, headache, nerve damage, paralysis, blood pressure changes, nausea, vomiting, reactions to medication both toxic or allergic, and postpartum back pain.  Patient expressed understanding and wished to proceed.  All questions were answered.  Sterile technique used throughout procedure and epidural site dressed with sterile barrier dressing. No paresthesia or other complications noted.The patient did not experience any signs of intravascular injection such as tinnitus or metallic taste in mouth nor signs of intrathecal spread such as rapid motor block. Please see nursing notes for vital signs.

## 2013-07-28 NOTE — Anesthesia Postprocedure Evaluation (Signed)
  Anesthesia Post-op Note  Patient: Laurie [name]  Procedure(s) Performed: * No procedures listed *  Patient Location: PACU and Mother/Baby  Anesthesia Type:Epidural  Level of Consciousness: awake, alert  and oriented  Airway and Oxygen Therapy: Patient Spontanous Breathing  Post-op Pain: none  Post-op Assessment: Post-op Vital signs reviewed, Patient's Cardiovascular Status Stable, No headache, No backache, No residual numbness and No residual motor weakness  Post-op Vital Signs: Reviewed and stable  Complications: No apparent anesthesia complications

## 2013-07-29 LAB — CBC
HCT: 29.6 % — ABNORMAL LOW (ref 36.0–46.0)
Hemoglobin: 9.6 g/dL — ABNORMAL LOW (ref 12.0–15.0)
MCH: 30 pg (ref 26.0–34.0)
MCHC: 32.4 g/dL (ref 30.0–36.0)
MCV: 92.5 fL (ref 78.0–100.0)
Platelets: 197 10*3/uL (ref 150–400)
RBC: 3.2 MIL/uL — AB (ref 3.87–5.11)
RDW: 14.4 % (ref 11.5–15.5)
WBC: 13.8 10*3/uL — AB (ref 4.0–10.5)

## 2013-07-29 MED ORDER — OXYCODONE-ACETAMINOPHEN 5-325 MG PO TABS: 1.0000 | ORAL_TABLET | Freq: Four times a day (QID) | ORAL | Status: DC | PRN

## 2013-07-29 MED ORDER — IBUPROFEN 800 MG PO TABS: 800.0000 mg | ORAL_TABLET | Freq: Three times a day (TID) | ORAL | Status: DC | PRN

## 2013-07-29 NOTE — Discharge Summary (Signed)
Obstetric Discharge Summary Reason for Admission: onset of labor Prenatal Procedures: none Intrapartum Procedures: spontaneous vaginal delivery Postpartum Procedures: none Complications-Operative and Postpartum: none Hemoglobin  Date Value Ref Range Status  07/29/2013 9.6* 12.0 - 15.0 g/dL Final     REPEATED TO VERIFY     DELTA CHECK NOTED     HCT  Date Value Ref Range Status  07/29/2013 29.6* 36.0 - 46.0 % Final    Physical Exam:  General: alert Lochia: appropriate Uterine Fundus: firm Incision: healing well DVT Evaluation: No evidence of DVT seen on physical exam.  Discharge Diagnoses: Term Pregnancy-delivered  Discharge Information: Date: 07/29/2013 Activity: pelvic rest Diet: routine Medications: PNV, Ibuprofen and Percocet Condition: stable Instructions: refer to practice specific booklet Discharge to: home Follow-up Information   [facility] Schedule an appointment as soon as possible for a visit in 6 weeks.   Contact information:   [address] [place] [id number] [phone]      Newborn Data: Live born female  Birth Weight: 8 lb 9 oz (3884 g) APGAR: 9, 9  Home with mother.  Laurie [name],Laurie [name] 07/29/2013, 7:52 AM

## 2013-07-30 ENCOUNTER — Ambulatory Visit: Payer: Self-pay

## 2013-07-30 NOTE — Lactation Note (Signed)
This note was copied from the chart of Laurie Elizebeth Brookingshley Sabine. Lactation Consultation Note  Patient Name: Laurie [name]: 07/30/2013 Reason for consult: Follow-up assessment;Difficult latch Mom had baby latched when I arrived to the right breast in cross cradle, baby had been nursing for 15 minutes and was sleepy. Demonstrated ways to keep baby awake at the breast. Baby demonstrated a good rhythmic suck with some swallows noted. Small amount of colostrum in the nipple shield. Mom's nipple on the right breast was more erect and with breast compression, LC was able to get baby to latch. Mom changed to left breast, changed nipple shield to size 20 from size 24 for better fit, Mom started in cross cradle but changed to football hold. Lots of colostrum visible in the nipple shield at the end of this feeding. Assisted Mom to set up her DEBP, Mom pumped for approx 15 minutes and received about 1 ml of EBM from left breast. Baby now asleep. Advised Mom to breastfeed with each feeding using the #20 nipple shield, keep baby actively nursing 15-30 minutes, both breasts if possible, post pump to encourage milk production. Give baby back any amount of EBM she receives. Try to post pump at least 4 times per day, BF only at night. If baby not satisfied at the breast, advised to supplement with EBM if available or formula, guidelines discussed with Mom. Reviewed how to supplement using curved tipped syringe. Engorgement care reviewed if needed. OP f/u scheduled for Thursday, 08/02/13 at 4:00 pm. Peds f/u tomorrow.   Maternal Data    Feeding Feeding Type: Breast Fed Length of feed: 15 min  LATCH Score/Interventions Latch: Repeated attempts needed to sustain latch, nipple held in mouth throughout feeding, stimulation needed to elicit sucking reflex. (using #20 nipple shield) Intervention(s): Adjust position;Assist with latch;Breast compression  Audible Swallowing: A few with stimulation  Type of Nipple: Flat  (left inverted) Intervention(s): Shells;Hand pump  Comfort (Breast/Nipple): Soft / non-tender     Hold (Positioning): Assistance needed to correctly position infant at breast and maintain latch. Intervention(s): Breastfeeding basics reviewed;Support Pillows;Position options;Skin to skin  LATCH Score: 6  Lactation Tools Discussed/Used Tools: Shells;Nipple Dorris CarnesShields;Pump Nipple shield size: 20;24 Shell Type: Inverted Breast pump type: Manual   Consult Status Consult Status: Follow-up [name]: 07/30/13 Follow-up type: In-patient    Alfred LevinsGranger, [name] 07/30/2013, 4:17 PM

## 2013-08-01 ENCOUNTER — Inpatient Hospital Stay (HOSPITAL_COMMUNITY): Admit: 2013-08-01 | Payer: PRIVATE HEALTH INSURANCE

## 2013-08-02 ENCOUNTER — Ambulatory Visit (HOSPITAL_COMMUNITY)
Admission: RE | Admit: 2013-08-02 | Discharge: 2013-08-02 | Disposition: A | Discharge status: Home / Self Care | Payer: PRIVATE HEALTH INSURANCE | Source: Ambulatory Visit | Attending: Obstetrics and Gynecology | Admitting: Obstetrics and Gynecology

## 2013-08-02 NOTE — Lactation Note (Signed)
Adult Lactation Consultation Outpatient Visit Note  Baby's (Laurie [name]) birth date 07/28/2015 Birthweight 8+9.  Today's weight at 5 days of life is 8#.  Patient Name: Laurie [name] Date of Birth: 09-Dec-1985 Gestational Age at Delivery: term Type of Delivery: vaginal  Breastfeeding History: Frequency of Breastfeeding: on cue Length of Feeding: 10-20 minutes Voids: 6+ Stools: 3+  Supplementing / Method: Pumping:  Type of Pump:freestyle   Frequency:Recommended post pumping 6 times in 24 hours.  Volume:    Comments:    Consultation Evaluation:  Initial Feeding Assessment: Pre-feed [id number]:3638 Post-feed UJWJXB:1478Weight:3662 Amount Transferred: 24 Comments:Mom has an inverted left nipple.  Attempted to latch Laurie [name] without NS but this was not successful.  Applied to the breast and she latched easily.  Many swallows were heard.  Additional Feeding Assessment: Pre-feed [id number]:3086Weight:3662 Post-feed VHQION:6295Weight:3704 Amount Transferred:42 Comments :Mom's right nipple is flat.  Attempted to latch without shield but was unsuccessful.  Laurie [name] latched easily and many swallows were heard.   Total Breast milk Transferred this Visit: 66 Total Supplement Given:   Additional Interventions:   Follow-Up  Maxie BetterLila will be weighed at the ped on Monday.  Mom is aware that frequent weight checks are recommend until we are sure that milk transfer is consistent and milk supply is well established.  May come to support group to check baby's weight.      Soyla DryerJoseph, Suha Schoenbeck 08/02/2013, 4:02 PM

## 2014-03-04 ENCOUNTER — Encounter (HOSPITAL_COMMUNITY): Payer: Self-pay | Admitting: *Deleted

## 2014-12-31 ENCOUNTER — Other Ambulatory Visit: Payer: Self-pay | Admitting: Obstetrics and Gynecology

## 2014-12-31 DIAGNOSIS — R928 Other abnormal and inconclusive findings on diagnostic imaging of breast: Secondary | ICD-10-CM

## 2015-01-03 ENCOUNTER — Ambulatory Visit
Admission: RE | Admit: 2015-01-03 | Discharge: 2015-01-03 | Disposition: A | Discharge status: Home / Self Care | Payer: PRIVATE HEALTH INSURANCE | Source: Ambulatory Visit | Attending: Obstetrics and Gynecology | Admitting: Obstetrics and Gynecology

## 2015-01-03 ENCOUNTER — Other Ambulatory Visit: Payer: Self-pay | Admitting: Obstetrics and Gynecology

## 2015-01-03 DIAGNOSIS — R928 Other abnormal and inconclusive findings on diagnostic imaging of breast: Secondary | ICD-10-CM

## 2015-01-03 DIAGNOSIS — N6310 Unspecified lump in the right breast, unspecified quadrant: Secondary | ICD-10-CM

## 2015-06-10 ENCOUNTER — Other Ambulatory Visit: Payer: Self-pay | Admitting: Obstetrics and Gynecology

## 2015-06-10 DIAGNOSIS — R921 Mammographic calcification found on diagnostic imaging of breast: Secondary | ICD-10-CM

## 2015-06-18 ENCOUNTER — Other Ambulatory Visit: Payer: Self-pay | Admitting: Obstetrics and Gynecology

## 2015-06-18 ENCOUNTER — Ambulatory Visit
Admission: RE | Admit: 2015-06-18 | Discharge: 2015-06-18 | Disposition: A | Discharge status: Home / Self Care | Payer: PRIVATE HEALTH INSURANCE | Source: Ambulatory Visit | Attending: Obstetrics and Gynecology | Admitting: Obstetrics and Gynecology

## 2015-06-18 DIAGNOSIS — R921 Mammographic calcification found on diagnostic imaging of breast: Secondary | ICD-10-CM

## 2015-11-03 ENCOUNTER — Other Ambulatory Visit: Payer: Self-pay | Admitting: Obstetrics and Gynecology

## 2015-11-03 DIAGNOSIS — R921 Mammographic calcification found on diagnostic imaging of breast: Secondary | ICD-10-CM

## 2015-11-10 ENCOUNTER — Ambulatory Visit
Admission: RE | Admit: 2015-11-10 | Discharge: 2015-11-10 | Disposition: A | Discharge status: Home / Self Care | Payer: PRIVATE HEALTH INSURANCE | Source: Ambulatory Visit | Attending: Obstetrics and Gynecology | Admitting: Obstetrics and Gynecology

## 2015-11-10 DIAGNOSIS — R921 Mammographic calcification found on diagnostic imaging of breast: Secondary | ICD-10-CM

## 2016-01-21 LAB — OB RESULTS CONSOLE GC/CHLAMYDIA
CHLAMYDIA, DNA PROBE: NEGATIVE
Gonorrhea: NEGATIVE

## 2016-01-21 LAB — OB RESULTS CONSOLE HIV ANTIBODY (ROUTINE TESTING): HIV: NONREACTIVE

## 2016-01-21 LAB — OB RESULTS CONSOLE RUBELLA ANTIBODY, IGM: RUBELLA: IMMUNE

## 2016-01-21 LAB — OB RESULTS CONSOLE RPR: RPR: NONREACTIVE

## 2016-01-21 LAB — OB RESULTS CONSOLE HEPATITIS B SURFACE ANTIGEN: HEP B S AG: NEGATIVE

## 2016-05-03 NOTE — L&D Delivery Note (Signed)
Delivery Note At 10:32 AM a viable female was delivered via Vaginal, Spontaneous Delivery (Presentation:VTX ;  ).  APGAR: 8, 9; weight  .   Placenta status:spont >>INTACT , .  Cord:  with the following complications: .  Cord pH: not sent  Anesthesia:   Episiotomy: None Lacerations: 2nd degree Suture Repair: 3.0 vicryl rapide Est. Blood Loss (mL):  300  Mom to postpartum.  Baby to Couplet care / Skin to Skin.  Meriel Pica 08/28/2016, 10:53 AM

## 2016-07-18 ENCOUNTER — Inpatient Hospital Stay (HOSPITAL_COMMUNITY)
Admission: AD | Admit: 2016-07-18 | Discharge: 2016-07-18 | Disposition: A | Discharge status: Home / Self Care | Payer: PRIVATE HEALTH INSURANCE | Source: Ambulatory Visit | Attending: Obstetrics and Gynecology | Admitting: Obstetrics and Gynecology

## 2016-07-18 ENCOUNTER — Encounter (HOSPITAL_COMMUNITY): Payer: Self-pay | Admitting: *Deleted

## 2016-07-18 DIAGNOSIS — Z3A33 33 weeks gestation of pregnancy: Secondary | ICD-10-CM | POA: Insufficient documentation

## 2016-07-18 DIAGNOSIS — O9989 Other specified diseases and conditions complicating pregnancy, childbirth and the puerperium: Secondary | ICD-10-CM

## 2016-07-18 DIAGNOSIS — R4582 Worries: Secondary | ICD-10-CM

## 2016-07-18 DIAGNOSIS — R4589 Other symptoms and signs involving emotional state: Secondary | ICD-10-CM

## 2016-07-18 DIAGNOSIS — O2243 Hemorrhoids in pregnancy, third trimester: Secondary | ICD-10-CM

## 2016-07-18 DIAGNOSIS — O4693 Antepartum hemorrhage, unspecified, third trimester: Secondary | ICD-10-CM | POA: Diagnosis present

## 2016-07-18 LAB — URINALYSIS, ROUTINE W REFLEX MICROSCOPIC
BILIRUBIN URINE: NEGATIVE
GLUCOSE, UA: NEGATIVE mg/dL
KETONES UR: NEGATIVE mg/dL
NITRITE: NEGATIVE
PH: 8 (ref 5.0–8.0)
Protein, ur: NEGATIVE mg/dL
Specific Gravity, Urine: 1.004 — ABNORMAL LOW (ref 1.005–1.030)

## 2016-07-18 NOTE — MAU Provider Note (Signed)
History     CSN: [id number]  Arrival date and time: 07/18/16 0002   None     Chief Complaint  Patient presents with  . Vaginal Bleeding   HPI   Laurie [name] is 31 y.o. female [phone] @ 8230w4d here in MAU with bleeding. She is unsure if the blood is coming from her rectum or her vagina.  At 1630 she initially saw the blood, she had some brown blood on her pad. Prior to bed she went to the bathroom and she saw bright red blood dripping on the floor. No bleeding with this pregnancy.  + fetal movement No issues with placenta that she knows about.  Denies pain 0/10  OB History    Gravida Para Term Preterm AB Living   4 1 1  0 2 1   SAB TAB Ectopic Multiple Live Births   2 0 0 0 1      Past Medical History:  Diagnosis Date  . Medical history non-contributory   . No pertinent past medical history     Past Surgical History:  Procedure Laterality Date  . DILATION AND CURETTAGE OF UTERUS    . DILATION AND EVACUATION  05/10/2012   Procedure: DILATATION AND EVACUATION;  Surgeon: Juluis MireJohn S McComb, MD;  Location: [facility];  Service: Gynecology;  Laterality: N/A;  . DILATION AND EVACUATION N/A 08/07/2012   Procedure: DILATATION AND EVACUATION WITH GENETIC STUDIES;  Surgeon: Juluis MireJohn S McComb, MD;  Location: [facility];  Service: Gynecology;  Laterality: N/A;  chromosome studies  . TONSILLECTOMY    . WISDOM TOOTH EXTRACTION      Family History  Problem Relation Age of Onset  . Hypertension Mother   . Hyperlipidemia Father   . Hypertension Father   . Asthma Maternal Aunt   . Cancer Maternal Aunt   . COPD Maternal Aunt   . Varicose Veins Maternal Aunt   . Diabetes Maternal Grandmother     Social History  Substance Use Topics  . Smoking status: Never Smoker  . Smokeless tobacco: Never Used  . Alcohol use No    Allergies:  Allergies  Allergen Reactions  . Sulfa Antibiotics Hives and Other (See Comments)    Causes paralysis    Prescriptions Prior to Admission  Medication Sig  Dispense Refill Last Dose  . Prenatal Vit-Fe Fumarate-FA (PRENATAL MULTIVITAMIN) TABS tablet Take 1 tablet by mouth at bedtime.   07/17/2016 at Unknown time  . ibuprofen (ADVIL,MOTRIN) 800 MG tablet Take 1 tablet (800 mg total) by mouth every 8 (eight) hours as needed for moderate pain. 30 tablet 0   . oxyCODONE-acetaminophen (PERCOCET/ROXICET) 5-325 MG per tablet Take 1-2 tablets by mouth every 6 (six) hours as needed for moderate pain. 30 tablet 0    Results for orders placed or performed during the hospital encounter of 07/18/16 (from the past 48 hour(s))  Urinalysis, Routine w reflex microscopic     Status: Abnormal   Collection Time: 07/18/16 12:03 AM  Result Value Ref Range   Color, Urine STRAW (A) YELLOW   APPearance CLEAR CLEAR   Specific Gravity, Urine 1.004 (L) 1.005 - 1.030   pH 8.0 5.0 - 8.0   Glucose, UA NEGATIVE NEGATIVE mg/dL   Hgb urine dipstick MODERATE (A) NEGATIVE   Bilirubin Urine NEGATIVE NEGATIVE   Ketones, ur NEGATIVE NEGATIVE mg/dL   Protein, ur NEGATIVE NEGATIVE mg/dL   Nitrite NEGATIVE NEGATIVE   Leukocytes, UA TRACE (A) NEGATIVE   RBC / HPF 0-5 0 - 5  RBC/hpf   WBC, UA 0-5 0 - 5 WBC/hpf   Bacteria, UA MANY (A) NONE SEEN   Squamous Epithelial / LPF 6-30 (A) NONE SEEN    Review of Systems  Gastrointestinal: Positive for constipation. Negative for abdominal pain.  Genitourinary: Negative for dysuria.   Physical Exam   Blood pressure 127/79, pulse 98, temperature 97.3 F (36.3 C), resp. rate 18, height 5\' 4"  (1.626 m), weight 171 lb (77.6 kg).  Physical Exam  Constitutional: She is oriented to person, place, and time. She appears well-developed and well-nourished. No distress.  HENT:  Head: Normocephalic.  Eyes: Pupils are equal, round, and reactive to light.  Neck: Neck supple.  Respiratory: Effort normal.  GI: Soft. She exhibits no distension. There is no tenderness. There is no rebound.  Genitourinary:  Genitourinary Comments: Vagina - Small amount  of white vaginal discharge, no odor  Cervix - No contact bleeding, no active bleeding  Bimanual exam: Cervix: closed, thick, posterior  Chaperone present for exam.   Musculoskeletal: Normal range of motion.  Neurological: She is alert and oriented to person, place, and time.  Skin: Skin is warm. She is not diaphoretic.  Psychiatric: Her behavior is normal.   Fetal Tracing: Baseline: 130 bpm  Variability: Moderate  Accelerations: 15x15 Decelerations: none Toco: UI   MAU Course  Procedures  None  MDM  Urine  Discussed patient with Dr. Elon Spanner @ 1248.> Ok to [employer] home.  Urine with moderate hematuria. Patient asymptomatic. Urine culture sent and pending.   Assessment and Plan   A:  1. Hemorrhoids during pregnancy in third trimester   2. Feeling worried      P:  Discharge home in stable condition Return to MAU if symptoms worsen Strict return precautions Follow up with OB    Duane Lope, NP 07/18/2016 11:17 AM

## 2016-07-18 NOTE — MAU Note (Signed)
Urine sent to lab 

## 2016-07-18 NOTE — MAU Note (Signed)
Been on my feet a lot Sat. About 1600 noticed a spot brown blood on panty liner. Have hemorrhoids so having some bleeding from that. Went to [employer] tonight and saw bright blood on panty liner and enough to drip on floor but unsure if vaginal or from hemorrhoid. Some low back pain

## 2016-07-18 NOTE — Discharge Instructions (Signed)
Hemorrhoids Hemorrhoids are swollen veins in and around the rectum or anus. Hemorrhoids can cause pain, itching, or bleeding. Most of the time, they do not cause serious problems. They usually get better with diet changes, lifestyle changes, and other home treatments. Follow these instructions at home: Eating and drinking   Eat foods that have fiber, such as whole grains, beans, nuts, fruits, and vegetables. Ask your doctor about taking products that have added fiber (fibersupplements).  Drink enough fluid to keep your pee (urine) clear or pale yellow. For Pain and Swelling   Take a warm-water bath (sitz bath) for 20 minutes to ease pain. Do this 3-4 times a day.  If directed, put ice on the painful area. It may be helpful to use ice between your warm baths.  Put ice in a plastic bag.  Place a towel between your skin and the bag.  Leave the ice on for 20 minutes, 2-3 times a day. General instructions   Take over-the-counter and prescription medicines only as told by your doctor.  Medicated creams and medicines that are inserted into the anus (suppositories) may be used or applied as told.  Exercise often.  Go to the bathroom when you have the urge to poop (to have a bowel movement). Do not wait.  Avoid pushing too hard (straining) when you poop.  Keep the butt area dry and clean. Use wet toilet paper or moist paper towels.  Do not sit on the toilet for a long time. Contact a doctor if:  You have any of these:  Pain and swelling that do not get better with treatment or medicine.  Bleeding that will not stop.  Trouble pooping or you cannot poop.  Pain or swelling outside the area of the hemorrhoids. This information is not intended to replace advice given to you by your health care provider. Make sure you discuss any questions you have with your health care provider. Document Released: 01/27/2008 Document Revised: 09/25/2015 Document Reviewed: 01/01/2015 Elsevier  Interactive Patient Education  2017 [employer]  

## 2016-07-19 LAB — CULTURE, OB URINE: SPECIAL REQUESTS: NORMAL

## 2016-08-05 LAB — OB RESULTS CONSOLE GBS: GBS: POSITIVE

## 2016-08-27 ENCOUNTER — Inpatient Hospital Stay (HOSPITAL_COMMUNITY)
Admission: AD | Admit: 2016-08-27 | Discharge: 2016-08-29 | DRG: 775 | Disposition: A | Discharge status: Home / Self Care | Payer: PRIVATE HEALTH INSURANCE | Source: Intra-hospital | Attending: Obstetrics and Gynecology | Admitting: Obstetrics and Gynecology

## 2016-08-27 DIAGNOSIS — Z3A39 39 weeks gestation of pregnancy: Secondary | ICD-10-CM

## 2016-08-27 DIAGNOSIS — O99824 Streptococcus B carrier state complicating childbirth: Secondary | ICD-10-CM | POA: Diagnosis present

## 2016-08-27 DIAGNOSIS — Z833 Family history of diabetes mellitus: Secondary | ICD-10-CM

## 2016-08-27 DIAGNOSIS — Z8249 Family history of ischemic heart disease and other diseases of the circulatory system: Secondary | ICD-10-CM

## 2016-08-27 NOTE — MAU Note (Signed)
Urine in lab 

## 2016-08-27 NOTE — MAU Note (Signed)
Some ctxs since lunch but stronger since this evening. Some bloody show. Leaking fld since about 1330. Light brown/tan mucous d/c.

## 2016-08-28 ENCOUNTER — Inpatient Hospital Stay (HOSPITAL_COMMUNITY): Payer: PRIVATE HEALTH INSURANCE | Admitting: Anesthesiology

## 2016-08-28 ENCOUNTER — Encounter (HOSPITAL_COMMUNITY): Payer: Self-pay | Admitting: *Deleted

## 2016-08-28 DIAGNOSIS — O4292 Full-term premature rupture of membranes, unspecified as to length of time between rupture and onset of labor: Secondary | ICD-10-CM | POA: Diagnosis present

## 2016-08-28 DIAGNOSIS — Z8249 Family history of ischemic heart disease and other diseases of the circulatory system: Secondary | ICD-10-CM | POA: Diagnosis not present

## 2016-08-28 DIAGNOSIS — O99824 Streptococcus B carrier state complicating childbirth: Secondary | ICD-10-CM | POA: Diagnosis present

## 2016-08-28 DIAGNOSIS — Z3A39 39 weeks gestation of pregnancy: Secondary | ICD-10-CM | POA: Diagnosis not present

## 2016-08-28 DIAGNOSIS — Z833 Family history of diabetes mellitus: Secondary | ICD-10-CM | POA: Diagnosis not present

## 2016-08-28 LAB — CBC
HCT: 37.1 % (ref 36.0–46.0)
Hemoglobin: 12.7 g/dL (ref 12.0–15.0)
MCH: 30.4 pg (ref 26.0–34.0)
MCHC: 34.2 g/dL (ref 30.0–36.0)
MCV: 88.8 fL (ref 78.0–100.0)
PLATELETS: 230 10*3/uL (ref 150–400)
RBC: 4.18 MIL/uL (ref 3.87–5.11)
RDW: 14.5 % (ref 11.5–15.5)
WBC: 11.8 10*3/uL — AB (ref 4.0–10.5)

## 2016-08-28 LAB — POCT FERN TEST: POCT FERN TEST: POSITIVE

## 2016-08-28 LAB — TYPE AND SCREEN
ABO/RH(D): O POS
ANTIBODY SCREEN: NEGATIVE

## 2016-08-28 LAB — RPR: RPR: NONREACTIVE

## 2016-08-28 MED ORDER — OXYTOCIN 40 UNITS IN LACTATED RINGERS INFUSION - SIMPLE MED
2.5000 [IU]/h | INTRAVENOUS | Status: DC
  Filled 2016-08-28: qty 1000

## 2016-08-28 MED ORDER — BISACODYL 10 MG RE SUPP
10.0000 mg | Freq: Every day | RECTAL | Status: DC | PRN
  Filled 2016-08-28: qty 1

## 2016-08-28 MED ORDER — EPHEDRINE 5 MG/ML INJ: 10.0000 mg | INTRAVENOUS | Status: DC | PRN

## 2016-08-28 MED ORDER — LIDOCAINE HCL (PF) 1 % IJ SOLN
30.0000 mL | INTRAMUSCULAR | Status: DC | PRN
  Filled 2016-08-28: qty 30

## 2016-08-28 MED ORDER — ONDANSETRON HCL 4 MG/2ML IJ SOLN: 4.0000 mg | Freq: Four times a day (QID) | INTRAMUSCULAR | Status: DC | PRN

## 2016-08-28 MED ORDER — TETANUS-DIPHTH-ACELL PERTUSSIS 5-2.5-18.5 LF-MCG/0.5 IM SUSP: 0.5000 mL | Freq: Once | INTRAMUSCULAR | Status: DC

## 2016-08-28 MED ORDER — BENZOCAINE-MENTHOL 20-0.5 % EX AERO
1.0000 "application " | INHALATION_SPRAY | CUTANEOUS | Status: DC | PRN
  Administered 2016-08-28: 1 via TOPICAL
  Filled 2016-08-28: qty 56

## 2016-08-28 MED ORDER — DIPHENHYDRAMINE HCL 50 MG/ML IJ SOLN: 12.5000 mg | INTRAMUSCULAR | Status: DC | PRN

## 2016-08-28 MED ORDER — PRENATAL MULTIVITAMIN CH
1.0000 | ORAL_TABLET | Freq: Every day | ORAL | Status: DC
  Administered 2016-08-28: 1 via ORAL
  Filled 2016-08-28: qty 1

## 2016-08-28 MED ORDER — PENICILLIN G POT IN DEXTROSE 60000 UNIT/ML IV SOLN
3.0000 10*6.[IU] | INTRAVENOUS | Status: DC
  Administered 2016-08-28: 3 10*6.[IU] via INTRAVENOUS
  Filled 2016-08-28 (×3): qty 50

## 2016-08-28 MED ORDER — WITCH HAZEL-GLYCERIN EX PADS
1.0000 "application " | MEDICATED_PAD | CUTANEOUS | Status: DC | PRN
  Administered 2016-08-28: 1 via TOPICAL

## 2016-08-28 MED ORDER — OXYTOCIN BOLUS FROM INFUSION
500.0000 mL | Freq: Once | INTRAVENOUS | Status: AC
  Administered 2016-08-28: 500 mL via INTRAVENOUS

## 2016-08-28 MED ORDER — OXYCODONE-ACETAMINOPHEN 5-325 MG PO TABS: 1.0000 | ORAL_TABLET | ORAL | Status: DC | PRN

## 2016-08-28 MED ORDER — LACTATED RINGERS IV SOLN: 500.0000 mL | INTRAVENOUS | Status: DC | PRN

## 2016-08-28 MED ORDER — FLEET ENEMA 7-19 GM/118ML RE ENEM: 1.0000 | ENEMA | RECTAL | Status: DC | PRN

## 2016-08-28 MED ORDER — OXYCODONE-ACETAMINOPHEN 5-325 MG PO TABS: 2.0000 | ORAL_TABLET | ORAL | Status: DC | PRN

## 2016-08-28 MED ORDER — LIDOCAINE HCL (PF) 1 % IJ SOLN
INTRAMUSCULAR | Status: DC | PRN
  Administered 2016-08-28: 4 mL via EPIDURAL

## 2016-08-28 MED ORDER — LACTATED RINGERS IV SOLN: 500.0000 mL | Freq: Once | INTRAVENOUS | Status: DC

## 2016-08-28 MED ORDER — ACETAMINOPHEN 325 MG PO TABS: 650.0000 mg | ORAL_TABLET | ORAL | Status: DC | PRN

## 2016-08-28 MED ORDER — SIMETHICONE 80 MG PO CHEW: 80.0000 mg | CHEWABLE_TABLET | ORAL | Status: DC | PRN

## 2016-08-28 MED ORDER — ONDANSETRON HCL 4 MG/2ML IJ SOLN: 4.0000 mg | INTRAMUSCULAR | Status: DC | PRN

## 2016-08-28 MED ORDER — FLEET ENEMA 7-19 GM/118ML RE ENEM: 1.0000 | ENEMA | Freq: Every day | RECTAL | Status: DC | PRN

## 2016-08-28 MED ORDER — IBUPROFEN 800 MG PO TABS
800.0000 mg | ORAL_TABLET | Freq: Three times a day (TID) | ORAL | Status: DC | PRN
  Administered 2016-08-28 – 2016-08-29 (×3): 800 mg via ORAL
  Filled 2016-08-28 (×3): qty 1

## 2016-08-28 MED ORDER — PHENYLEPHRINE 40 MCG/ML (10ML) SYRINGE FOR IV PUSH (FOR BLOOD PRESSURE SUPPORT): 80.0000 ug | PREFILLED_SYRINGE | INTRAVENOUS | Status: DC | PRN

## 2016-08-28 MED ORDER — ONDANSETRON HCL 4 MG PO TABS: 4.0000 mg | ORAL_TABLET | ORAL | Status: DC | PRN

## 2016-08-28 MED ORDER — MEASLES, MUMPS & RUBELLA VAC ~~LOC~~ INJ
0.5000 mL | INJECTION | Freq: Once | SUBCUTANEOUS | Status: DC
  Filled 2016-08-28: qty 0.5

## 2016-08-28 MED ORDER — SOD CITRATE-CITRIC ACID 500-334 MG/5ML PO SOLN: 30.0000 mL | ORAL | Status: DC | PRN

## 2016-08-28 MED ORDER — SENNOSIDES-DOCUSATE SODIUM 8.6-50 MG PO TABS
2.0000 | ORAL_TABLET | ORAL | Status: DC
  Administered 2016-08-29: 2 via ORAL
  Filled 2016-08-28: qty 2

## 2016-08-28 MED ORDER — DIPHENHYDRAMINE HCL 25 MG PO CAPS: 25.0000 mg | ORAL_CAPSULE | Freq: Four times a day (QID) | ORAL | Status: DC | PRN

## 2016-08-28 MED ORDER — DIBUCAINE 1 % RE OINT: 1.0000 "application " | TOPICAL_OINTMENT | RECTAL | Status: DC | PRN

## 2016-08-28 MED ORDER — LACTATED RINGERS IV SOLN
INTRAVENOUS | Status: DC
  Administered 2016-08-28 (×2): via INTRAVENOUS

## 2016-08-28 MED ORDER — FENTANYL 2.5 MCG/ML BUPIVACAINE 1/10 % EPIDURAL INFUSION (WH - ANES)
14.0000 mL/h | INTRAMUSCULAR | Status: DC | PRN
  Administered 2016-08-28: 14 mL/h via EPIDURAL
  Filled 2016-08-28: qty 100

## 2016-08-28 MED ORDER — COCONUT OIL OIL
1.0000 "application " | TOPICAL_OIL | Status: DC | PRN
  Filled 2016-08-28: qty 120

## 2016-08-28 MED ORDER — ZOLPIDEM TARTRATE 5 MG PO TABS: 5.0000 mg | ORAL_TABLET | Freq: Every evening | ORAL | Status: DC | PRN

## 2016-08-28 MED ORDER — DEXTROSE 5 % IV SOLN
5.0000 10*6.[IU] | Freq: Once | INTRAVENOUS | Status: AC
  Administered 2016-08-28: 5 10*6.[IU] via INTRAVENOUS
  Filled 2016-08-28: qty 5

## 2016-08-28 MED ORDER — PHENYLEPHRINE 40 MCG/ML (10ML) SYRINGE FOR IV PUSH (FOR BLOOD PRESSURE SUPPORT)
80.0000 ug | PREFILLED_SYRINGE | INTRAVENOUS | Status: DC | PRN
  Filled 2016-08-28: qty 10

## 2016-08-28 NOTE — MAU Provider Note (Signed)
S: Ms. Laurie [name] is a 32 y.o. [phone] at [redacted]w[redacted]d  who presents to MAU today complaining of leaking of fluid since 1330. She endorses scant vaginal bleeding. She endorses regular frequent contractions. She reports normal fetal movement.    O: BP 137/88 (BP Location: Right Arm)   Pulse 93   Temp 98.4 F (36.9 C)   Resp 20   Ht  (1.626 m)   Wt 180 lb (81.6 kg)   BMI 30.90 kg/m  GENERAL: Well-developed, well-nourished female in no acute distress.  HEAD: Normocephalic, atraumatic.  CHEST: Normal effort of breathing, regular heart rate ABDOMEN: Soft, nontender, gravid PELVIC: Normal external female genitalia. Vagina is pink and rugated. Cervix with normal contour, no lesions. Normal discharge.  + pooling.   Cervical exam: deferred due to ROM     Fetal Monitoring: Baseline: 130 bpm Variability: moderate Accelerations: 15 x 15 Decelerations: none Contractions: q 2 minutes  Fern - Positive   A: [facility] at [redacted]w[redacted]d  SROM  P: Report given to RN to contact MD on call for further instructions  Marny Lowenstein, PA-C 08/28/2016 12:36 AM

## 2016-08-28 NOTE — Anesthesia Procedure Notes (Signed)
Epidural Patient location during procedure: OB Start time: 08/28/2016 7:55 AM End time: 08/28/2016 8:01 AM  Staffing Anesthesiologist: Shona Simpson D Performed: anesthesiologist   Preanesthetic Checklist Completed: patient identified, site marked, surgical consent, pre-op evaluation, timeout performed, IV checked, risks and benefits discussed and monitors and equipment checked  Epidural Patient position: sitting Prep: ChloraPrep Patient monitoring: heart rate, continuous pulse ox and blood pressure Approach: midline Location: L3-L4 Injection technique: LOR saline  Needle:  Needle type: Tuohy  Needle gauge: 17 G Needle length: 9 cm Catheter type: closed end flexible Catheter size: 20 Guage Test dose: negative and 1.5% lidocaine  Assessment Events: blood not aspirated, injection not painful, no injection resistance and no paresthesia  Additional Notes LOR @ 6  Patient identified. Risks/Benefits/Options discussed with patient including but not limited to bleeding, infection, nerve damage, paralysis, failed block, incomplete pain control, headache, blood pressure changes, nausea, vomiting, reactions to medications, itching and postpartum back pain. Confirmed with bedside nurse the patient's most recent platelet count. Confirmed with patient that they are not currently taking any anticoagulation, have any bleeding history or any family history of bleeding disorders. Patient expressed understanding and wished to proceed. All questions were answered. Sterile technique was used throughout the entire procedure. Please see nursing notes for vital signs. Test dose was given through epidural catheter and negative prior to continuing to dose epidural or start infusion. Warning signs of high block given to the patient including shortness of breath, tingling/numbness in hands, complete motor block, or any concerning symptoms with instructions to call for help. Patient was given instructions on fall  risk and not to get out of bed. All questions and concerns addressed with instructions to call with any issues or inadequate analgesia.    Reason for block:procedure for pain

## 2016-08-28 NOTE — Anesthesia Postprocedure Evaluation (Signed)
Anesthesia Post Note  Patient: Laurie [name]  Procedure(s) Performed: * No procedures listed *  Patient location during evaluation: Mother Baby Anesthesia Type: Epidural Level of consciousness: awake, awake and alert, oriented and patient cooperative Pain management: pain level controlled Vital Signs Assessment: post-procedure vital signs reviewed and stable Respiratory status: spontaneous breathing, nonlabored ventilation and respiratory function stable Cardiovascular status: stable Postop Assessment: no headache, no backache, epidural receding, patient able to bend at knees and no signs of nausea or vomiting Anesthetic complications: no        Last Vitals:  Vitals:   08/28/16 1215 08/28/16 1400  BP: 120/69 116/74  Pulse: 91 94  Resp: 18 16  Temp: 36.9 C 36.6 C    Last Pain:  Vitals:   08/28/16 1400  TempSrc: Oral  PainSc: 0-No pain   Pain Goal: Patients Stated Pain Goal: 7 (08/28/16 0142)               [name]

## 2016-08-28 NOTE — Anesthesia Pain Management Evaluation Note (Signed)
  [facility] Pain Management Visit Note  Patient: Laurie [name], 31 y.o., female  "Hello I am a member of the anesthesia team at [facility] We have an anesthesia team available at all times to provide care throughout the hospital, including epidural management and anesthesia for C-section. I don't know your plan for the delivery whether it a natural birth, water birth, IV sedation, nitrous supplementation, doula or epidural, but we want to meet your pain goals."   1.Was your pain managed to your expectations on prior hospitalizations?   Yes   2.What is your expectation for pain management during this hospitalization?     Epidural  3.How can we help you reach that goal? unsure  Record the patient's initial score and the patient's pain goal.   Pain: 8  Pain Goal: 8 [facility] wants you to be able to say your pain was always managed very well.  Cephus Shelling 08/28/2016

## 2016-08-28 NOTE — Plan of Care (Signed)
Problem: Activity: Goal: Will verbalize the importance of balancing activity with adequate rest periods Outcome: Completed/Met Date Met: 08/28/16 Encouraged patient to call for assistance to the bathroom until staff notifies that she may get up alone. Also discussed the importance of increased ambulation throughout her stay.   Problem: Education: Goal: Knowledge of condition will improve Admission education reviewed with patient and significant other. Parents have a three year old and delivered at [facility] hospital and are comfortable with unit routines and infant care.   Problem: Nutritional: Goal: Mothers verbalization of comfort with breastfeeding process will improve Outcome: Completed/Met Date Met: 08/28/16 Discussed importance of hand expression and skin to skin. Demonstrated hand expression to patient.

## 2016-08-28 NOTE — H&P (Signed)
Laurie [name] is a 31 y.o. female presenting for labor, SROM. OB History    Gravida Para Term Preterm AB Living   0 2 1   SAB TAB Ectopic Multiple Live Births   2 0 0 0 1     Past Medical History:  Diagnosis Date  . Medical history non-contributory   . No pertinent past medical history    Past Surgical History:  Procedure Laterality Date  . DILATION AND CURETTAGE OF UTERUS    . DILATION AND EVACUATION  05/10/2012   Procedure: DILATATION AND EVACUATION;  Surgeon: [name], MD;  Location: [facility];  Service: Gynecology;  Laterality: N/A;  . DILATION AND EVACUATION N/A 08/07/2012   Procedure: DILATATION AND EVACUATION WITH GENETIC STUDIES;  Surgeon: [name], MD;  Location: [facility];  Service: Gynecology;  Laterality: N/A;  chromosome studies  . TONSILLECTOMY    . WISDOM TOOTH EXTRACTION     Family History: family history includes Asthma in her maternal aunt; COPD in her maternal aunt; Cancer in her maternal aunt; Diabetes in her maternal grandmother; Hyperlipidemia in her father; Hypertension in her father and mother; Varicose Veins in her maternal aunt. Social History:  reports that she has never smoked. She has never used smokeless tobacco. She reports that she does not drink alcohol or use drugs.     Maternal Diabetes: No Genetic Screening: Normal Maternal Ultrasounds/Referrals: Normal Fetal Ultrasounds or other Referrals:  None Maternal Substance Abuse:  No Significant Maternal Medications:  None Significant Maternal Lab Results:  None Other Comments:  None  ROS History Dilation: 6.5 Effacement (%): 80 Station: -2 Exam by:: Laurie [name] Blood pressure 140/84, pulse (!) 108, temperature 98.6 F (37 C), temperature source Axillary, resp. rate 16, height  (1.626 m), weight 81.6 kg (180 lb), SpO2 97 %. Exam Physical Exam  Prenatal labs: ABO, Rh: --/--/O POS (04/28 0115) Antibody: NEG (04/28 0115) Rubella: Immune (09/20 0000) RPR: Nonreactive (09/20 0000)   HBsAg: Negative (09/20 0000)  HIV: Non-reactive (09/20 0000)  GBS: Positive (04/05 0000)   Assessment/Plan: Term IUP, ABX for + GBS Requests epid   Laurie [name] M 08/28/2016, 8:23 AM

## 2016-08-28 NOTE — Anesthesia Preprocedure Evaluation (Addendum)
Anesthesia Evaluation  Patient identified by MRN, date of birth, ID band Patient awake    Reviewed: Allergy & Precautions, Patient's Chart, lab work & pertinent test results  Airway Mallampati: II  TM Distance: >3 FB Neck ROM: Full    Dental  (+) Teeth Intact, Dental Advisory Given   Pulmonary neg pulmonary ROS,    breath sounds clear to auscultation       Cardiovascular negative cardio ROS   Rhythm:Regular Rate:Normal     Neuro/Psych negative neurological ROS  negative psych ROS   GI/Hepatic negative GI ROS, Neg liver ROS,   Endo/Other  negative endocrine ROS  Renal/GU negative Renal ROS  negative genitourinary   Musculoskeletal negative musculoskeletal ROS (+)   Abdominal   Peds negative pediatric ROS (+)  Hematology negative hematology ROS (+)   Anesthesia Other Findings   Reproductive/Obstetrics (+) Pregnancy                            Lab Results  Component Value Date   WBC 11.8 (H) 08/28/2016   HGB 12.7 08/28/2016   HCT 37.1 08/28/2016   MCV 88.8 08/28/2016   PLT 230 08/28/2016     Anesthesia Physical Anesthesia Plan  ASA: II  Anesthesia Plan: Epidural   Post-op Pain Management:    Induction:   Airway Management Planned: Natural Airway  Additional Equipment:   Intra-op Plan:   Post-operative Plan:   Informed Consent: I have reviewed the patients History and Physical, chart, labs and discussed the procedure including the risks, benefits and alternatives for the proposed anesthesia with the patient or authorized representative who has indicated his/her understanding and acceptance.     Plan Discussed with:   Anesthesia Plan Comments:         Anesthesia Quick Evaluation

## 2016-08-29 ENCOUNTER — Encounter (HOSPITAL_COMMUNITY): Payer: Self-pay | Admitting: *Deleted

## 2016-08-29 LAB — CBC
HEMATOCRIT: 32.7 % — AB (ref 36.0–46.0)
HEMOGLOBIN: 11.1 g/dL — AB (ref 12.0–15.0)
MCH: 31.3 pg (ref 26.0–34.0)
MCHC: 33.9 g/dL (ref 30.0–36.0)
MCV: 92.1 fL (ref 78.0–100.0)
Platelets: 190 10*3/uL (ref 150–400)
RBC: 3.55 MIL/uL — AB (ref 3.87–5.11)
RDW: 14.5 % (ref 11.5–15.5)
WBC: 13 10*3/uL — AB (ref 4.0–10.5)

## 2016-08-29 MED ORDER — IBUPROFEN 800 MG PO TABS: 800.0000 mg | ORAL_TABLET | Freq: Three times a day (TID) | ORAL | 1 refills | Status: AC | PRN

## 2016-08-29 NOTE — Plan of Care (Signed)
Problem: Education: Goal: Knowledge of condition will improve Patient states she has her own #20 nipple shield. States she used one with her last child on her left nipple because that nipple is flat and baby can not latch on. Patient has hand pump and encouraged frequent hand expression and using the hand pump prior to latching baby to pull nipple out some. Encouraged patient to call out when baby latches in order for staff to assess latch. Patient also to see lactation today prior to discharge.

## 2016-08-29 NOTE — Discharge Summary (Signed)
Obstetric Discharge Summary Reason for Admission: onset of labor Prenatal Procedures: none Intrapartum Procedures: spontaneous vaginal delivery Postpartum Procedures: none Complications-Operative and Postpartum: none Hemoglobin  Date Value Ref Range Status  08/29/2016 11.1 (L) 12.0 - 15.0 g/dL Final   HCT  Date Value Ref Range Status  08/29/2016 32.7 (L) 36.0 - 46.0 % Final    Physical Exam:  General: alert Lochia: appropriate Uterine Fundus: firm Incision: healing well DVT Evaluation: No evidence of DVT seen on physical exam.  Discharge Diagnoses: Term Pregnancy-delivered  Discharge Information: Date: 08/29/2016 Activity: pelvic rest Diet: routine Medications: PNV and Ibuprofen Condition: stable Instructions: refer to practice specific booklet Discharge to: home Follow-up Information    [facility]. Schedule an appointment as soon as possible for a visit in 6 week(s).   Contact information: [address] [place] [address] [phone]           Newborn Data: Live born female  Birth Weight: 8 lb 12.9 oz (3995 g) APGAR: 8, 9  Home with mother.  Laurie [name] 08/29/2016, 8:14 AM

## 2016-08-29 NOTE — Lactation Note (Signed)
This note was copied from a baby's chart. Lactation Consultation Note:  Lactation brochure given with information on LC services. Mother breastfed her first child for 10 months. Mother used a nipple shield due to inverted left nipple, Infant is feeding well. Mother has latched using #20 nipple shield. She reports seeing colostrum in the nipple shield. Assist mother with positioning infant in cross cradle hold. Infant latched but mother described slight pinching which was resolved with a slight chin tug. Infant sustained latch for 20 mins with audible swallowing. Mother has large amt of colostrum flowing. Observed that nipple was long in shape when infant released the breast. Nipple shield applied to the (L) nipple and infant refused feeding at this time was satisfied.  Observed that infant has a tight upper lip labial frenula as well as a tight anterior lingual frenula. Advised mother to have tongue evaluated if she persist with sore nipples and infant is unable to transfer well enough to gain weight.  Mother advised to feeding infant with feeding cues, allow for cluster feeding and feed at least 8-12 times in 24 hours. Mother to follow up with Peds within a few days for a weight check. Mother is aware of available LC services.   Patient Name: Laurie [name] [id number] Date: 08/29/2016 Reason for consult: Follow-up assessment   Maternal Data    Feeding Feeding Type: Breast Fed Length of feed: 20 min  LATCH Score/Interventions Latch: Grasps breast easily, tongue down, lips flanged, rhythmical sucking. Intervention(s): Adjust position;Breast compression  Audible Swallowing: Spontaneous and intermittent Intervention(s): Hand expression  Type of Nipple: Everted at rest and after stimulation ((R) nipple everted, (L) nipple inverted)  Comfort (Breast/Nipple): Filling, red/small blisters or bruises, mild/mod discomfort  Problem noted: Filling Interventions (Filling): Hand pump  Hold  (Positioning): No assistance needed to correctly position infant at breast. (only slight chin tug to widen gape) Intervention(s): Support Pillows;Position options  LATCH Score: 9  Lactation Tools Discussed/Used     Consult Status      Michel Bickers 08/29/2016, 11:47 AM

## 2016-08-29 NOTE — Plan of Care (Signed)
Problem: Education: Goal: Knowledge of condition will improve Discharge education reviewed with patient and significant other. Patient verbalizes understanding.    

## 2016-09-03 ENCOUNTER — Ambulatory Visit: Payer: Self-pay

## 2016-09-03 NOTE — Lactation Note (Signed)
This note was copied from a baby's chart. Lactation Consult  Mother's reason for visit: Mother has concerns of weight loss. Infants birth weight was 8-12.9,  Monday 7-15,  Thursday 8-0  Visit Type: feeding assessment   Mother reports that infant is sleepy most of the time and she has to wake her for feedings. Mother reports that when she breastfeeds she latches on and off for several mins then falls asleep at the breast.  Mother is using a #20 nipple shield . Mother has everted nipple on the right and inverted on the left.  Mother reports that she sees milk in the nipple shield when infant releases the breast.  Mother breast are firm and full.   Consult:  Initial Lactation Consultant:  Michel Bickers  ________________________________________________________________________    ________________________________________________________________________  Mother's Name: Laurie [name] Type of delivery:  Vaginal, Spontaneous Delivery Breastfeeding Experience:  10 months with first child Maternal Medical Conditions:  mother was on Progesterone injections during pregnancy Maternal Medications:  Prenatal vits  ________________________________________________________________________  Breastfeeding History (Post Discharge)  Frequency of breastfeeding: every 2 hours breastfeeding  Duration of feeding:  60 mins.   Supplementation  Formula:  Volume 2 ounces  Frequency:2 times daily        Brand: Enfamil  Breastmilk:  Volume 2 ounces 2 times daily  Method:  Bottle,   Pumping  Type of pump:  Free Style Frequency: every 2 hours 10-15 mins Volume:  4-6 ounces  Infant Intake and Output Assessment  Voids: 8-10  in 24 hrs.  Color:  Clear yellow Stools: 6-8  in 24 hrs.  Color:  Yellow  ________________________________________________________________________  Maternal Breast Assessment  Breast:  Full Nipple:  Erect Pain level:  0 Pain interventions:   Bra  _______________________________________________________________________ Feeding Assessment/Evaluation:   I suspect  that infant is not latching deep enough on the back of the nipple shield. Infant is tirelessly tugging on the tip of the nipple shield. Observed that infants top and bottom lip are blanched from rolling lips inward and infant has a suck blister on the top lip Attempt to latch infant without the nipple shield and infant refused. 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 I do not suspect that this is causing any breast feeding concerns.  I did not recommend referral to Specialist.    Initial feeding assessment:   infant latched on with a shallow latch, assist mother with flanging infants lips for wider gape.  Infant suckled on and off for a few mins.  Advised mother in breast compression , infant sustained latch for 20 mins with a transfer of 46 ml  Advised mother to work on better postioning and using nipple to nose latch on technique.  Advised to flange infants lips for wider gape .   Infant's oral assessment:  WNL  Positioning:  Cross cradle Right breast  LATCH documentation:  Latch:  2 = Grasps breast easily, tongue down, lips flanged, rhythmical sucking.  Audible swallowing:  2 = Spontaneous and intermittent  Type of nipple:  2 = Everted at rest and after stimulation  Comfort (Breast/Nipple):  1 = Filling, red/small blisters or bruises, mild/mod discomfort  Hold (Positioning):  2 = No assistance needed to correctly position infant at breast  LATCH score:    Attached assessment:  Deep  Lips flanged:  Yes.    Lips untucked:  Yes.    Suck assessment:  Displays both  Mother using a #20 nipple shield   Pre-feed weight:  8-0.6, 3646 Post-feed weight: 8-2.2, 3696  Amount transferred: 46 ml   Total amount transferred:46 ml   Advised mother to continue to cue base feed,   Wake infant during the day for feedings if still sleeping at 3 hours Advised mother to breastfeed infant 8-12 times in 24 hours Suggested good breast compression while  feeding Mother to supplement infant with EBM/formula after each feeding Give infant 30 ml - 60 ml after each feeding. Mother to continue to post pump 4-6 times daily Encouraged mother to nap frequently  Advised wearing Maylee in the evenings after feedings,  when she wants to cluster feed [facility] Lactation services as needed

## 2021-07-08 ENCOUNTER — Other Ambulatory Visit: Payer: Self-pay | Admitting: Obstetrics and Gynecology

## 2021-07-08 DIAGNOSIS — N644 Mastodynia: Secondary | ICD-10-CM

## 2021-07-29 ENCOUNTER — Ambulatory Visit
Admission: RE | Admit: 2021-07-29 | Discharge: 2021-07-29 | Disposition: A | Discharge status: Home / Self Care | Payer: PRIVATE HEALTH INSURANCE | Source: Ambulatory Visit | Attending: Obstetrics and Gynecology | Admitting: Obstetrics and Gynecology

## 2021-07-29 DIAGNOSIS — N644 Mastodynia: Secondary | ICD-10-CM
